# Patient Record
Sex: Female | Born: 1935 | Race: White | Hispanic: No | Marital: Single | Smoking: Never smoker
Health system: Southern US, Community
[De-identification: ages and names within clinical notes are randomized; demographics above are authoritative.]

## PROBLEM LIST (undated history)

## (undated) DIAGNOSIS — Z87442 Personal history of urinary calculi: Secondary | ICD-10-CM

## (undated) DIAGNOSIS — F419 Anxiety disorder, unspecified: Secondary | ICD-10-CM

## (undated) DIAGNOSIS — L723 Sebaceous cyst: Secondary | ICD-10-CM

## (undated) DIAGNOSIS — N200 Calculus of kidney: Secondary | ICD-10-CM

## (undated) DIAGNOSIS — R011 Cardiac murmur, unspecified: Secondary | ICD-10-CM

## (undated) HISTORY — PX: TONSILLECTOMY: SUR1361

## (undated) HISTORY — PX: OTHER SURGICAL HISTORY: SHX169

## (undated) HISTORY — PX: ABDOMINAL HYSTERECTOMY: SHX81

---

## 1998-01-19 ENCOUNTER — Ambulatory Visit (HOSPITAL_COMMUNITY): Admission: RE | Admit: 1998-01-19 | Discharge: 1998-01-19 | Payer: Self-pay | Admitting: *Deleted

## 1998-01-21 ENCOUNTER — Other Ambulatory Visit: Admission: RE | Admit: 1998-01-21 | Discharge: 1998-01-21 | Payer: Self-pay | Admitting: *Deleted

## 1998-06-02 ENCOUNTER — Observation Stay (HOSPITAL_COMMUNITY): Admission: RE | Admit: 1998-06-02 | Discharge: 1998-06-03 | Payer: Self-pay | Admitting: Gynecology

## 1999-01-19 ENCOUNTER — Encounter (INDEPENDENT_AMBULATORY_CARE_PROVIDER_SITE_OTHER): Payer: Self-pay | Admitting: Specialist

## 1999-01-19 ENCOUNTER — Ambulatory Visit (HOSPITAL_COMMUNITY): Admission: RE | Admit: 1999-01-19 | Discharge: 1999-01-19 | Payer: Self-pay | Admitting: Gastroenterology

## 1999-02-01 ENCOUNTER — Ambulatory Visit (HOSPITAL_COMMUNITY): Admission: RE | Admit: 1999-02-01 | Discharge: 1999-02-01 | Payer: Self-pay | Admitting: *Deleted

## 1999-11-23 ENCOUNTER — Encounter: Admission: RE | Admit: 1999-11-23 | Discharge: 1999-11-23 | Payer: Self-pay | Admitting: Gynecology

## 1999-11-23 ENCOUNTER — Encounter: Payer: Self-pay | Admitting: Gynecology

## 1999-11-29 ENCOUNTER — Encounter (INDEPENDENT_AMBULATORY_CARE_PROVIDER_SITE_OTHER): Payer: Self-pay

## 1999-11-29 ENCOUNTER — Other Ambulatory Visit: Admission: RE | Admit: 1999-11-29 | Discharge: 1999-11-29 | Payer: Self-pay | Admitting: Gynecology

## 2000-02-08 ENCOUNTER — Other Ambulatory Visit: Admission: RE | Admit: 2000-02-08 | Discharge: 2000-02-08 | Payer: Self-pay | Admitting: *Deleted

## 2000-02-14 ENCOUNTER — Ambulatory Visit (HOSPITAL_COMMUNITY): Admission: RE | Admit: 2000-02-14 | Discharge: 2000-02-14 | Payer: Self-pay | Admitting: *Deleted

## 2001-02-03 ENCOUNTER — Encounter: Payer: Self-pay | Admitting: Gynecology

## 2001-02-03 ENCOUNTER — Encounter: Admission: RE | Admit: 2001-02-03 | Discharge: 2001-02-03 | Payer: Self-pay | Admitting: Gynecology

## 2001-12-22 ENCOUNTER — Encounter: Payer: Self-pay | Admitting: Internal Medicine

## 2001-12-22 ENCOUNTER — Ambulatory Visit (HOSPITAL_COMMUNITY): Admission: RE | Admit: 2001-12-22 | Discharge: 2001-12-22 | Payer: Self-pay | Admitting: Internal Medicine

## 2003-06-22 ENCOUNTER — Ambulatory Visit (HOSPITAL_COMMUNITY): Admission: RE | Admit: 2003-06-22 | Discharge: 2003-06-22 | Payer: Self-pay | Admitting: Gastroenterology

## 2004-01-03 ENCOUNTER — Encounter: Admission: RE | Admit: 2004-01-03 | Discharge: 2004-01-03 | Payer: Self-pay | Admitting: Gynecology

## 2005-01-08 ENCOUNTER — Ambulatory Visit (HOSPITAL_COMMUNITY): Admission: RE | Admit: 2005-01-08 | Discharge: 2005-01-08 | Payer: Self-pay | Admitting: Internal Medicine

## 2005-06-14 ENCOUNTER — Encounter: Admission: RE | Admit: 2005-06-14 | Discharge: 2005-06-14 | Payer: Self-pay | Admitting: Internal Medicine

## 2006-04-19 ENCOUNTER — Ambulatory Visit (HOSPITAL_COMMUNITY): Admission: RE | Admit: 2006-04-19 | Discharge: 2006-04-19 | Payer: Self-pay | Admitting: Internal Medicine

## 2006-06-03 ENCOUNTER — Other Ambulatory Visit: Admission: RE | Admit: 2006-06-03 | Discharge: 2006-06-03 | Payer: Self-pay | Admitting: Internal Medicine

## 2007-05-09 ENCOUNTER — Ambulatory Visit (HOSPITAL_COMMUNITY): Admission: RE | Admit: 2007-05-09 | Discharge: 2007-05-09 | Payer: Self-pay | Admitting: Internal Medicine

## 2008-01-22 ENCOUNTER — Ambulatory Visit (HOSPITAL_COMMUNITY): Admission: RE | Admit: 2008-01-22 | Discharge: 2008-01-22 | Payer: Self-pay | Admitting: Gastroenterology

## 2008-06-07 ENCOUNTER — Ambulatory Visit: Payer: Self-pay | Admitting: Internal Medicine

## 2008-07-14 ENCOUNTER — Ambulatory Visit: Payer: Self-pay | Admitting: Internal Medicine

## 2008-11-19 ENCOUNTER — Ambulatory Visit (HOSPITAL_COMMUNITY): Admission: RE | Admit: 2008-11-19 | Discharge: 2008-11-19 | Payer: Self-pay | Admitting: Internal Medicine

## 2009-06-09 ENCOUNTER — Ambulatory Visit: Payer: Self-pay | Admitting: Internal Medicine

## 2009-06-10 ENCOUNTER — Ambulatory Visit: Payer: Self-pay | Admitting: Internal Medicine

## 2009-09-01 ENCOUNTER — Ambulatory Visit: Payer: Self-pay | Admitting: Internal Medicine

## 2009-12-02 ENCOUNTER — Ambulatory Visit (HOSPITAL_COMMUNITY): Admission: RE | Admit: 2009-12-02 | Discharge: 2009-12-02 | Payer: Self-pay | Admitting: Internal Medicine

## 2009-12-05 ENCOUNTER — Inpatient Hospital Stay (HOSPITAL_COMMUNITY): Admission: EM | Admit: 2009-12-05 | Discharge: 2009-12-06 | Payer: Self-pay | Admitting: Emergency Medicine

## 2010-03-21 ENCOUNTER — Encounter: Admission: RE | Admit: 2010-03-21 | Discharge: 2010-04-03 | Payer: Self-pay | Admitting: Internal Medicine

## 2010-09-11 LAB — BASIC METABOLIC PANEL
CO2: 25 mEq/L (ref 19–32)
Chloride: 113 mEq/L — ABNORMAL HIGH (ref 96–112)
GFR calc Af Amer: >60 mL/min (ref >60)
Potassium: 3.9 mEq/L (ref 3.5–5.1)
Sodium: 141 mEq/L (ref 135–145)

## 2010-09-11 LAB — COMPREHENSIVE METABOLIC PANEL
ALT: 11 U/L (ref 0–35)
Albumin: 3.9 g/dL (ref 3.5–5.2)
Alkaline Phosphatase: 80 U/L (ref 39–117)
BUN: 20 mg/dL (ref 6–23)
Potassium: 4.2 mEq/L (ref 3.5–5.1)
Sodium: 142 mEq/L (ref 135–145)
Total Protein: 7.1 g/dL (ref 6.0–8.3)

## 2010-09-11 LAB — CBC
Hemoglobin: 10.5 g/dL — ABNORMAL LOW (ref 12.0–15.0)
MCHC: 33.7 g/dL (ref 30.0–36.0)
MCV: 94.9 fL (ref 78.0–100.0)
Platelets: 259 10*3/uL (ref 150–400)
RBC: 3.29 MIL/uL — ABNORMAL LOW (ref 3.87–5.11)
RDW: 13.3 % (ref 11.5–15.5)

## 2010-09-11 LAB — DIFFERENTIAL
Basophils Relative: 0 % (ref 0–1)
Basophils Relative: 0 % (ref 0–1)
Eosinophils Absolute: 0 10*3/uL (ref 0.0–0.7)
Lymphs Abs: 1.1 10*3/uL (ref 0.7–4.0)
Monocytes Absolute: 0.4 10*3/uL (ref 0.1–1.0)
Monocytes Absolute: 0.8 10*3/uL (ref 0.1–1.0)
Monocytes Relative: 11 % (ref 3–12)
Monocytes Relative: 3 % (ref 3–12)
Neutro Abs: 12.2 10*3/uL — ABNORMAL HIGH (ref 1.7–7.7)

## 2010-09-11 LAB — URINALYSIS, ROUTINE W REFLEX MICROSCOPIC
Glucose, UA: NEGATIVE mg/dL
Protein, ur: NEGATIVE mg/dL
pH: 7 (ref 5.0–8.0)

## 2010-09-11 LAB — URINE MICROSCOPIC-ADD ON

## 2010-09-11 LAB — URINE CULTURE

## 2010-11-07 NOTE — Op Note (Signed)
NAMESABRA, SESSLER               ACCOUNT NO.:  [id number]   MEDICAL RECORD NO.:  [id number]          PATIENT TYPE:  AMB   LOCATION:  ENDO                         FACILITY:  [facility]   PHYSICIAN:  Petra Kuba, M.D.    DATE OF BIRTH:  02/06/1936   DATE OF PROCEDURE:  01/22/2008  DATE OF DISCHARGE:                               OPERATIVE REPORT   PROCEDURE:  Colonoscopy.   INDICATIONS FOR PROCEDURE:  Family history of colon cancer, personal  history of colon polyps, due for colonic screening.   Consent was signed after risks, benefits, methods, options thoroughly  discussed multiple times in the past.   MEDICINES USED:  Diprivan per anesthesia 180 mcg, fentanyl 50 mcg,  Versed 1 mg.   PROCEDURE IN DETAIL:  Rectal inspection internal and external  hemorrhoids small.  Digital exam was negative.  Pediatric colonoscope  was inserted and despite a long looping tortuous colon was able to be  advanced to the cecum.  No abdominal pressure or position changes were  needed.  There was significant sigmoid and descending diverticula.  Moderate some large and small but no other abnormalities seen on  insertion.  Cecum was identified by the appendiceal orifice and  ileocecal valve.  Slow withdrawal through the colon.  There was a  moderate amount of stool adherent to the wall which required washing and  suctioning.  On slow withdrawal through the colon no polypoid lesions,  masses or other abnormalities were seen but the diverticula mentioned  above.  This was slowly retrieved back to the rectum.  Once back in the  rectum the anorectal pull-through and retroflexion confirmed the small  hemorrhoids.  Scope was straightened, air was suctioned, scope removed.  The patient tolerated the procedure well.  There was no obvious  immediate complication.   ENDOSCOPIC DIAGNOSES:  1. Internal and external small hemorrhoids.  2. Left-sided moderate diverticula.  3. Otherwise within normal limits to the  cecum.   PLAN:  Recheck colon screening in 5 years either proceed the same way or  virtual colonoscopy depending on what is available, what insurance will  cover, etc.  I will be happy to see her back sooner p.r.n. otherwise  return care to Dr. Lenord Fellers for the customary healthcare screening and  maintenance.           ______________________________  Petra Kuba, M.D.     MEM/MEDQ  D:  01/22/2008  T:  01/22/2008  Job:  [id number]   cc:   Luanna Cole. Lenord Fellers, M.D.  Fax: [phone]

## 2011-01-30 ENCOUNTER — Other Ambulatory Visit: Payer: Self-pay | Admitting: Internal Medicine

## 2011-01-30 ENCOUNTER — Other Ambulatory Visit (HOSPITAL_COMMUNITY): Payer: Self-pay | Admitting: Internal Medicine

## 2011-01-30 DIAGNOSIS — Z1231 Encounter for screening mammogram for malignant neoplasm of breast: Secondary | ICD-10-CM

## 2011-01-31 ENCOUNTER — Ambulatory Visit
Admission: RE | Admit: 2011-01-31 | Discharge: 2011-01-31 | Disposition: A | Discharge status: Home / Self Care | Payer: BC Managed Care – PPO | Source: Ambulatory Visit | Attending: Internal Medicine | Admitting: Internal Medicine

## 2011-01-31 DIAGNOSIS — Z1231 Encounter for screening mammogram for malignant neoplasm of breast: Secondary | ICD-10-CM

## 2011-02-02 ENCOUNTER — Ambulatory Visit (HOSPITAL_COMMUNITY): Payer: Self-pay

## 2012-07-03 ENCOUNTER — Other Ambulatory Visit: Payer: Self-pay | Admitting: Internal Medicine

## 2012-07-03 DIAGNOSIS — R319 Hematuria, unspecified: Secondary | ICD-10-CM

## 2012-07-07 ENCOUNTER — Ambulatory Visit
Admission: RE | Admit: 2012-07-07 | Discharge: 2012-07-07 | Disposition: A | Discharge status: Home / Self Care | Payer: BC Managed Care – PPO | Source: Ambulatory Visit | Attending: Internal Medicine | Admitting: Internal Medicine

## 2012-07-07 DIAGNOSIS — R319 Hematuria, unspecified: Secondary | ICD-10-CM

## 2012-12-05 ENCOUNTER — Other Ambulatory Visit: Payer: Self-pay | Admitting: Internal Medicine

## 2012-12-05 DIAGNOSIS — R319 Hematuria, unspecified: Secondary | ICD-10-CM

## 2012-12-05 DIAGNOSIS — R109 Unspecified abdominal pain: Secondary | ICD-10-CM

## 2012-12-09 ENCOUNTER — Ambulatory Visit
Admission: RE | Admit: 2012-12-09 | Discharge: 2012-12-09 | Disposition: A | Discharge status: Home / Self Care | Payer: BC Managed Care – PPO | Source: Ambulatory Visit | Attending: Internal Medicine | Admitting: Internal Medicine

## 2012-12-09 DIAGNOSIS — R319 Hematuria, unspecified: Secondary | ICD-10-CM

## 2012-12-09 DIAGNOSIS — R109 Unspecified abdominal pain: Secondary | ICD-10-CM

## 2012-12-09 MED ORDER — IOHEXOL 300 MG/ML  SOLN
125.0000 mL | Freq: Once | INTRAMUSCULAR | Status: AC | PRN
  Administered 2012-12-09: 125 mL via INTRAVENOUS

## 2012-12-23 ENCOUNTER — Other Ambulatory Visit: Payer: Self-pay | Admitting: Internal Medicine

## 2012-12-23 DIAGNOSIS — N289 Disorder of kidney and ureter, unspecified: Secondary | ICD-10-CM

## 2013-01-09 ENCOUNTER — Other Ambulatory Visit: Payer: BC Managed Care – PPO

## 2013-01-12 ENCOUNTER — Ambulatory Visit
Admission: RE | Admit: 2013-01-12 | Discharge: 2013-01-12 | Disposition: A | Discharge status: Home / Self Care | Payer: BC Managed Care – PPO | Source: Ambulatory Visit | Attending: Internal Medicine | Admitting: Internal Medicine

## 2013-01-12 DIAGNOSIS — N289 Disorder of kidney and ureter, unspecified: Secondary | ICD-10-CM

## 2013-01-12 MED ORDER — GADOBENATE DIMEGLUMINE 529 MG/ML IV SOLN
11.0000 mL | Freq: Once | INTRAVENOUS | Status: AC | PRN
  Administered 2013-01-12: 11 mL via INTRAVENOUS

## 2015-01-26 ENCOUNTER — Other Ambulatory Visit: Payer: Self-pay | Admitting: Surgery

## 2015-02-11 ENCOUNTER — Other Ambulatory Visit: Payer: Self-pay | Admitting: Surgery

## 2015-03-02 ENCOUNTER — Encounter (HOSPITAL_BASED_OUTPATIENT_CLINIC_OR_DEPARTMENT_OTHER): Payer: Self-pay | Admitting: *Deleted

## 2015-03-06 NOTE — H&P (Signed)
  Vanessa Cannon  Location: [facility] Patient #: [id number] DOB: [birth year] Single / Language: [name] / Race: White Female  History of Present Illness (Maliya Marich A. Ninfa Linden MD Patient words: seb cyst neck.  The patient is a 79 year old female who presents with a subcutaneous abscess. This is a pleasant female referred by Dr. [name] for infected sebaceous cyst on the back of her neck. She reports that she has had it for many years. It has intermittently become infected. She has just finished a course of antibiotics   Other Problems Davy Pique Bynum, [place]; Cholelithiasis Gastroesophageal Reflux Disease Heart murmur Hemorrhoids Kidney Stone  Past Surgical History Marjean Donna, CMA;  Colon Polyp Removal - Colonoscopy Hysterectomy (not due to cancer) - Complete  Diagnostic Studies History Marjean Donna, CMA;  Colonoscopy 5-10 years ago Mammogram 1-3 years ago Pap Smear >5 years ago  Allergies Marjean Donna, CMA; No Known Drug Allergies08/08/2014  Medication History (Sonya Bynum, CMA;  No Current Medications Medications Reconciled  Social History Davy Pique Bynum, CMA; Alcohol use Moderate alcohol use. Caffeine use Coffee, Tea. No drug use Tobacco use Never smoker.  Family History Marjean Donna, [place];  Cerebrovascular Accident Sister. Heart Disease Mother. Hypertension Father. Respiratory Condition Father.  Pregnancy / Birth History Marjean Donna, [place];  Age at menarche 60 years. Age of menopause 89-50  Review of Systems ([place];  HEENT Not Present- Earache, Hearing Loss, Hoarseness, Nose Bleed, Oral Ulcers, Ringing in the Ears, Seasonal Allergies, Sinus Pain, Sore Throat, Visual Disturbances, Wears glasses/contact lenses and Yellow Eyes. Respiratory Not Present- Bloody sputum, Chronic Cough, Difficulty Breathing, Snoring and Wheezing. Breast Not Present- Breast Mass, Breast Pain, Nipple Discharge and Skin Changes. Cardiovascular Not  Present- Chest Pain, Difficulty Breathing Lying Down, Leg Cramps, Palpitations, Rapid Heart Rate, Shortness of Breath and Swelling of Extremities. Gastrointestinal Not Present- Abdominal Pain, Bloating, Bloody Stool, Change in Bowel Habits, Chronic diarrhea, Constipation, Difficulty Swallowing, Excessive gas, Gets full quickly at meals, Hemorrhoids, Indigestion, Nausea, Rectal Pain and Vomiting. Endocrine Not Present- Cold Intolerance, Excessive Hunger, Hair Changes, Heat Intolerance, Hot flashes and New Diabetes. Hematology Present- Easy Bruising. Not Present- Excessive bleeding, Gland problems, HIV and Persistent Infections.   Vitals (Sonya Bynum CMA  Weight: 124 lb Height: 64in Body Surface Area: 1.59 m Body Mass Index: 21.28 kg/m Temp.: 12F(Temporal)  Pulse: 79 (Regular)  BP: 138/74 (Sitting, Left Arm, Standard)    Physical Exam (Keviana Guida A. Ninfa Linden MD;  The physical exam findings are as follows: Note:On exam, there is a very large sebaceous cyst on the back of her neck. Lungs clear CV RRR Abd soft, NT      Assessment & Plan (Kashten Gowin A. Ninfa Linden MD; CHRONICALLY FECTED SEBACEOUS CYST (706.2  L72.3)  Plan excision of the posterior neck cyst in the OR to prevent recurrent infections.  I discussed the risks which include, but are not limited to bleeding, infection, recurrence, etc.  She agrees to proceed.

## 2015-03-07 ENCOUNTER — Encounter (HOSPITAL_BASED_OUTPATIENT_CLINIC_OR_DEPARTMENT_OTHER): Payer: Self-pay | Admitting: *Deleted

## 2015-03-07 ENCOUNTER — Ambulatory Visit (HOSPITAL_BASED_OUTPATIENT_CLINIC_OR_DEPARTMENT_OTHER): Payer: BC Managed Care – PPO | Admitting: Anesthesiology

## 2015-03-07 ENCOUNTER — Encounter (HOSPITAL_BASED_OUTPATIENT_CLINIC_OR_DEPARTMENT_OTHER)
Admission: RE | Disposition: A | Discharge status: Home / Self Care | Payer: Self-pay | Source: Ambulatory Visit | Attending: Surgery

## 2015-03-07 ENCOUNTER — Ambulatory Visit (HOSPITAL_BASED_OUTPATIENT_CLINIC_OR_DEPARTMENT_OTHER)
Admission: RE | Admit: 2015-03-07 | Discharge: 2015-03-07 | Disposition: A | Discharge status: Home / Self Care | Payer: BC Managed Care – PPO | Source: Ambulatory Visit | Attending: Surgery | Admitting: Surgery

## 2015-03-07 DIAGNOSIS — R011 Cardiac murmur, unspecified: Secondary | ICD-10-CM | POA: Insufficient documentation

## 2015-03-07 DIAGNOSIS — K219 Gastro-esophageal reflux disease without esophagitis: Secondary | ICD-10-CM | POA: Diagnosis not present

## 2015-03-07 DIAGNOSIS — L720 Epidermal cyst: Secondary | ICD-10-CM | POA: Insufficient documentation

## 2015-03-07 DIAGNOSIS — Z87442 Personal history of urinary calculi: Secondary | ICD-10-CM | POA: Diagnosis not present

## 2015-03-07 DIAGNOSIS — L723 Sebaceous cyst: Secondary | ICD-10-CM | POA: Diagnosis not present

## 2015-03-07 HISTORY — DX: Cardiac murmur, unspecified: R01.1

## 2015-03-07 HISTORY — PX: EAR CYST EXCISION: SHX22

## 2015-03-07 HISTORY — DX: Sebaceous cyst: L72.3

## 2015-03-07 HISTORY — DX: Anxiety disorder, unspecified: F41.9

## 2015-03-07 LAB — POCT HEMOGLOBIN-HEMACUE: HEMOGLOBIN: 10.9 g/dL — AB (ref 12.0–15.0)

## 2015-03-07 SURGERY — CYST REMOVAL
Anesthesia: Monitor Anesthesia Care | Site: Neck | Laterality: Right

## 2015-03-07 MED ORDER — FENTANYL CITRATE (PF) 100 MCG/2ML IJ SOLN
INTRAMUSCULAR | Status: AC
  Filled 2015-03-07: qty 4

## 2015-03-07 MED ORDER — FENTANYL CITRATE (PF) 100 MCG/2ML IJ SOLN: 25.0000 ug | INTRAMUSCULAR | Status: DC | PRN

## 2015-03-07 MED ORDER — LIDOCAINE-EPINEPHRINE (PF) 1 %-1:200000 IJ SOLN
INTRAMUSCULAR | Status: DC | PRN
  Administered 2015-03-07: 10 mL

## 2015-03-07 MED ORDER — LIDOCAINE HCL (CARDIAC) 20 MG/ML IV SOLN
INTRAVENOUS | Status: DC | PRN
  Administered 2015-03-07: 50 mg via INTRAVENOUS

## 2015-03-07 MED ORDER — ONDANSETRON HCL 4 MG/2ML IJ SOLN: 4.0000 mg | Freq: Once | INTRAMUSCULAR | Status: DC | PRN

## 2015-03-07 MED ORDER — CEFAZOLIN SODIUM-DEXTROSE 2-3 GM-% IV SOLR
INTRAVENOUS | Status: AC
  Filled 2015-03-07: qty 50

## 2015-03-07 MED ORDER — FENTANYL CITRATE (PF) 100 MCG/2ML IJ SOLN
50.0000 ug | INTRAMUSCULAR | Status: DC | PRN
  Administered 2015-03-07: 50 ug via INTRAVENOUS

## 2015-03-07 MED ORDER — PROPOFOL 10 MG/ML IV BOLUS
INTRAVENOUS | Status: DC | PRN
  Administered 2015-03-07 (×2): 20 mg via INTRAVENOUS

## 2015-03-07 MED ORDER — CEFAZOLIN SODIUM-DEXTROSE 2-3 GM-% IV SOLR
2.0000 g | INTRAVENOUS | Status: AC
  Administered 2015-03-07: 2 g via INTRAVENOUS

## 2015-03-07 MED ORDER — LACTATED RINGERS IV SOLN
INTRAVENOUS | Status: DC
  Administered 2015-03-07: 07:00:00 via INTRAVENOUS

## 2015-03-07 MED ORDER — PROPOFOL 500 MG/50ML IV EMUL
INTRAVENOUS | Status: AC
  Filled 2015-03-07: qty 50

## 2015-03-07 MED ORDER — SCOPOLAMINE 1 MG/3DAYS TD PT72: 1.0000 | MEDICATED_PATCH | Freq: Once | TRANSDERMAL | Status: DC | PRN

## 2015-03-07 MED ORDER — GLYCOPYRROLATE 0.2 MG/ML IJ SOLN: 0.2000 mg | Freq: Once | INTRAMUSCULAR | Status: DC | PRN

## 2015-03-07 MED ORDER — ONDANSETRON HCL 4 MG/2ML IJ SOLN
INTRAMUSCULAR | Status: AC
  Filled 2015-03-07: qty 2

## 2015-03-07 MED ORDER — MIDAZOLAM HCL 2 MG/2ML IJ SOLN: 1.0000 mg | INTRAMUSCULAR | Status: DC | PRN

## 2015-03-07 MED ORDER — ONDANSETRON HCL 4 MG/2ML IJ SOLN
INTRAMUSCULAR | Status: DC | PRN
Start: 2015-03-07 — End: 2015-03-07
  Administered 2015-03-07: 4 mg via INTRAVENOUS

## 2015-03-07 MED ORDER — TRAMADOL HCL 50 MG PO TABS: 50.0000 mg | ORAL_TABLET | Freq: Four times a day (QID) | ORAL | Status: DC | PRN

## 2015-03-07 SURGICAL SUPPLY — 44 items
BLADE CLIPPER SURG (BLADE) IMPLANT
BLADE HEX COATED 2.75 (ELECTRODE) ×4 IMPLANT
BLADE SURG 15 STRL LF DISP TIS (BLADE) ×2 IMPLANT
BLADE SURG 15 STRL SS (BLADE) ×4
CANISTER SUCT 1200ML W/VALVE (MISCELLANEOUS) IMPLANT
CHLORAPREP W/TINT 26ML (MISCELLANEOUS) ×1 IMPLANT
COVER BACK TABLE 60X90IN (DRAPES) ×4 IMPLANT
COVER MAYO STAND STRL (DRAPES) ×4 IMPLANT
DECANTER SPIKE VIAL GLASS SM (MISCELLANEOUS) IMPLANT
DRAPE LAPAROTOMY 100X72 PEDS (DRAPES) ×4 IMPLANT
DRAPE UTILITY XL STRL (DRAPES) ×4 IMPLANT
ELECT REM PT RETURN 9FT ADLT (ELECTROSURGICAL) ×4
ELECTRODE REM PT RTRN 9FT ADLT (ELECTROSURGICAL) ×2 IMPLANT
GLOVE BIOGEL PI IND STRL 7.5 (GLOVE) ×1 IMPLANT
GLOVE BIOGEL PI INDICATOR 7.5 (GLOVE) ×2
GLOVE EXAM NITRILE MD LF STRL (GLOVE) ×3 IMPLANT
GLOVE SURG SIGNA 7.5 PF LTX (GLOVE) ×4 IMPLANT
GLOVE SURG SS PI 7.0 STRL IVOR (GLOVE) ×3 IMPLANT
GOWN STRL REUS W/ TWL LRG LVL3 (GOWN DISPOSABLE) ×2 IMPLANT
GOWN STRL REUS W/ TWL XL LVL3 (GOWN DISPOSABLE) ×2 IMPLANT
GOWN STRL REUS W/TWL LRG LVL3 (GOWN DISPOSABLE) ×4
GOWN STRL REUS W/TWL XL LVL3 (GOWN DISPOSABLE) ×4
LIQUID BAND (GAUZE/BANDAGES/DRESSINGS) ×5 IMPLANT
NDL HYPO 25X1 1.5 SAFETY (NEEDLE) ×1 IMPLANT
NEEDLE HYPO 25X1 1.5 SAFETY (NEEDLE) ×4 IMPLANT
NS IRRIG 1000ML POUR BTL (IV SOLUTION) IMPLANT
PACK BASIN DAY SURGERY FS (CUSTOM PROCEDURE TRAY) ×4 IMPLANT
PENCIL BUTTON HOLSTER BLD 10FT (ELECTRODE) ×4 IMPLANT
SLEEVE SCD COMPRESS KNEE MED (MISCELLANEOUS) IMPLANT
SPONGE LAP 4X18 X RAY DECT (DISPOSABLE) ×4 IMPLANT
SUT MNCRL AB 4-0 PS2 18 (SUTURE) ×3 IMPLANT
SUT PROLENE 3 0 PS 2 (SUTURE) IMPLANT
SUT VIC AB 2-0 SH 27 (SUTURE)
SUT VIC AB 2-0 SH 27XBRD (SUTURE) IMPLANT
SUT VIC AB 3-0 SH 27 (SUTURE) ×4
SUT VIC AB 3-0 SH 27X BRD (SUTURE) ×1 IMPLANT
SYR BULB 3OZ (MISCELLANEOUS) IMPLANT
SYR CONTROL 10ML LL (SYRINGE) ×4 IMPLANT
TOWEL OR 17X24 6PK STRL BLUE (TOWEL DISPOSABLE) ×4 IMPLANT
TOWEL OR NON WOVEN STRL DISP B (DISPOSABLE) ×4 IMPLANT
TRAY DSU PREP LF (CUSTOM PROCEDURE TRAY) ×3 IMPLANT
TUBE CONNECTING 20'X1/4 (TUBING)
TUBE CONNECTING 20X1/4 (TUBING) IMPLANT
YANKAUER SUCT BULB TIP NO VENT (SUCTIONS) IMPLANT

## 2015-03-07 NOTE — Interval H&P Note (Signed)
History and Physical Interval Note: no change in H and P  03/07/2015 7:09 AM  Vanessa Cannon  has presented today for surgery, with the diagnosis of Sebaceous Cyst  The various methods of treatment have been discussed with the patient and family. After consideration of risks, benefits and other options for treatment, the patient has consented to  Procedure(s): EXCISION OF CHRONIC SEBACEOUS CYST ON NECK (N/A) as a surgical intervention .  The patient's history has been reviewed, patient examined, no change in status, stable for surgery.  I have reviewed the patient's chart and labs.  Questions were answered to the patient's satisfaction.     Lashauna Arpin A

## 2015-03-07 NOTE — Op Note (Signed)
CYST EXCISION RIGHT POSTERIOR NECK  Procedure Note  Vanessa Cannon 03/07/2015   Pre-op Diagnosis: Sebaceous Cyst Right Posterior Neck     Post-op Diagnosis: same  Procedure(s): CYST EXCISION RIGHT POSTERIOR NECK (1 CM )  Surgeon(s): Coralie Keens, MD  Anesthesia: Monitor Anesthesia Care  Staff:  Circulator: Maurene Capes, RN Scrub Person: Romero Liner, CST  Estimated Blood Loss: Minimal               Specimens: SENT TO [facility]  Vanessa Cannon   Date: 03/07/2015  Time: 10:16 AM

## 2015-03-07 NOTE — Discharge Instructions (Signed)
Ok to shower starting tomorrow  Ice pack ibuprofen and/or tylenol for pain    Post Anesthesia Home Care Instructions  Activity: Get plenty of rest for the remainder of the day. A responsible adult should stay with you for 24 hours following the procedure.  For the next 24 hours, DO NOT: -Drive a car -Paediatric nurse -Drink alcoholic beverages -Take any medication unless instructed by your physician -Make any legal decisions or sign important papers.  Meals: Start with liquid foods such as gelatin or soup. Progress to regular foods as tolerated. Avoid greasy, spicy, heavy foods. If nausea and/or vomiting occur, drink only clear liquids until the nausea and/or vomiting subsides. Call your physician if vomiting continues.  Special Instructions/Symptoms: Your throat may feel dry or sore from the anesthesia or the breathing tube placed in your throat during surgery. If this causes discomfort, gargle with warm salt water. The discomfort should disappear within 24 hours.  If you had a scopolamine patch placed behind your ear for the management of post- operative nausea and/or vomiting:  1. The medication in the patch is effective for 72 hours, after which it should be removed.  Wrap patch in a tissue and discard in the trash. Wash hands thoroughly with soap and water. 2. You may remove the patch earlier than 72 hours if you experience unpleasant side effects which may include dry mouth, dizziness or visual disturbances. 3. Avoid touching the patch. Wash your hands with soap and water after contact with the patch.

## 2015-03-07 NOTE — Transfer of Care (Signed)
Immediate Anesthesia Transfer of Care Note  Patient: [name]  Procedure(s) Performed: Procedure(s): CYST EXCISION RIGHT POSTERIOR NECK (Right)  Patient Location: PACU  Anesthesia Type:MAC  Level of Consciousness: awake, alert  and oriented  Airway & Oxygen Therapy: Patient Spontanous Breathing and Patient connected to face mask oxygen  Post-op Assessment: Report given to RN and Post -op Vital signs reviewed and stable  Post vital signs: Reviewed and stable  Last Vitals:  Filed Vitals:   03/07/15 1023  BP: 127/45  Pulse: 84  Temp:   Resp:     Complications: No apparent anesthesia complications

## 2015-03-07 NOTE — Anesthesia Postprocedure Evaluation (Signed)
  Anesthesia Post-op Note  Patient: Vanessa Cannon  Procedure(s) Performed: Procedure(s) (LRB): CYST EXCISION RIGHT POSTERIOR NECK (Right)  Patient Location: PACU  Anesthesia Type: MAC  Level of Consciousness: awake and alert   Airway and Oxygen Therapy: Patient Spontanous Breathing  Post-op Pain: mild  Post-op Assessment: Post-op Vital signs reviewed, Patient's Cardiovascular Status Stable, Respiratory Function Stable, Patent Airway and No signs of Nausea or vomiting  Last Vitals:  Filed Vitals:   03/07/15 1128  BP: 138/48  Pulse: 71  Temp: 36.7 C  Resp: 16    Post-op Vital Signs: stable   Complications: No apparent anesthesia complications

## 2015-03-07 NOTE — Anesthesia Preprocedure Evaluation (Signed)
Anesthesia Evaluation  Patient identified by MRN, date of birth, ID band Patient awake    Reviewed: Allergy & Precautions, NPO status , Patient's Chart, lab work & pertinent test results  History of Anesthesia Complications Negative for: history of anesthetic complications  Airway Mallampati: II  TM Distance: >3 FB Neck ROM: Full    Dental  (+) Teeth Intact, Dental Advisory Given   Pulmonary neg pulmonary ROS,    Pulmonary exam normal breath sounds clear to auscultation       Cardiovascular Exercise Tolerance: Good (-) hypertension(-) angina(-) Past MI negative cardio ROS Normal cardiovascular exam Rhythm:Regular Rate:Normal     Neuro/Psych PSYCHIATRIC DISORDERS Anxiety negative neurological ROS     GI/Hepatic negative GI ROS, Neg liver ROS,   Endo/Other  negative endocrine ROS  Renal/GU negative Renal ROS     Musculoskeletal negative musculoskeletal ROS (+)   Abdominal   Peds  Hematology  (+) Blood dyscrasia, anemia ,   Anesthesia Other Findings Day of surgery medications reviewed with the patient.  Reproductive/Obstetrics                             Anesthesia Physical Anesthesia Plan  ASA: II  Anesthesia Plan: MAC   Post-op Pain Management:    Induction: Intravenous  Airway Management Planned: Nasal Cannula  Additional Equipment:   Intra-op Plan:   Post-operative Plan:   Informed Consent: I have reviewed the patients History and Physical, chart, labs and discussed the procedure including the risks, benefits and alternatives for the proposed anesthesia with the patient or authorized representative who has indicated his/her understanding and acceptance.   Dental advisory given  Plan Discussed with: CRNA and Anesthesiologist  Anesthesia Plan Comments: (Discussed risks/benefits/alternatives to MAC sedation including need for ventilatory support, hypotension, need for  conversion to general anesthesia.  All patient questions answered.  Patient wished to proceed.)        Anesthesia Quick Evaluation

## 2015-03-08 ENCOUNTER — Encounter (HOSPITAL_BASED_OUTPATIENT_CLINIC_OR_DEPARTMENT_OTHER): Payer: Self-pay | Admitting: Surgery

## 2015-03-08 NOTE — Op Note (Signed)
Vanessa Cannon, Vanessa Cannon               ACCOUNT NO.:  [id number]  MEDICAL RECORD NO.:  [id number]  LOCATION:                               FACILITY:  [facility]  PHYSICIAN:  Coralie Keens, M.D. DATE OF BIRTH:  10/05/1935  DATE OF PROCEDURE:  03/07/2015 DATE OF DISCHARGE:  03/07/2015                              OPERATIVE REPORT   PREOPERATIVE DIAGNOSIS:  Chronically infected right posterior neck sebaceous cyst.  POSTOPERATIVE DIAGNOSIS:  Chronically infected right posterior neck sebaceous cyst.  PROCEDURE:  Excision of 1 cm right posterior neck sebaceous cyst.  SURGEON:  Coralie Keens, M.D.  ANESTHESIA:  1% lidocaine and monitored anesthesia care.  ESTIMATED BLOOD LOSS:  Minimal.  INDICATIONS:  This is a 79 year old female who has had multiple infections of a sebaceous cyst on the back of her neck.  Because of this, decision was made to excise the cyst.  PROCEDURE IN DETAIL:  The patient was brought to the operating room, identified as [name].  She was placed supine on the operating room table and placed in the left lateral decubitus position. Anesthesia was then induced.  Her posterior neck was then prepped and draped in usual sterile fashion.  I anesthetized the skin around the previous scar with lidocaine.  I then made an elliptical incision incorporated scar with the scalpel.  I then took this down into the subcutaneous tissue with the cautery.  I was able to excise the cyst and its capsule in its entirety with the cautery and a scalpel.  This was sent to Pathology for evaluation.  I then achieved hemostasis with the cautery.  I anesthetized the wound further with lidocaine.  I closed subcutaneous tissue with interrupted 3-0 Vicryl sutures and closed the skin with a running 4-0 Monocryl.  Skin glue was then applied.  The patient tolerated the procedure well.  All the counts were correct at the end of the procedure.  The patient was taken in a stable condition to the  operating room.     Coralie Keens, M.D.     DB/MEDQ  D:  03/07/2015  T:  03/08/2015  Job:  [id number]

## 2015-04-06 IMAGING — CT CT ABD-PEL WO/W CM
3 of 10 series · 12 of 46 positions shown, 18 images · IV contrast (WATER & [ID] OMNI 300)
Comparison: none

CLINICAL DATA: Hematuria his.  History of ureteral injury

CT ABDOMEN AND PELVIS WITHOUT AND WITH CONTRAST
TECHNIQUE: Multidetector CT imaging of the abdomen and pelvis was
performed without contrast material in one or both body regions,
followed by contrast material(s) and further sections in one or
both body regions.
Contrast: 125mL OMNIPAQUE IOHEXOL 300 MG/ML  SOLN
BUN and creatinine were obtained on site at [HOSPITAL] at
[HOSPITAL]..
Results:  BUN 12 mg/dL,  Creatinine 0.7 mg/dL.

[Series 2: hematuria a/p w/o >45 · axial · non-contrast · 0.66mm/px · z∈[-339,-299]mm · 2 of 74 slices shown]
[im 9/74  soft-tissue]
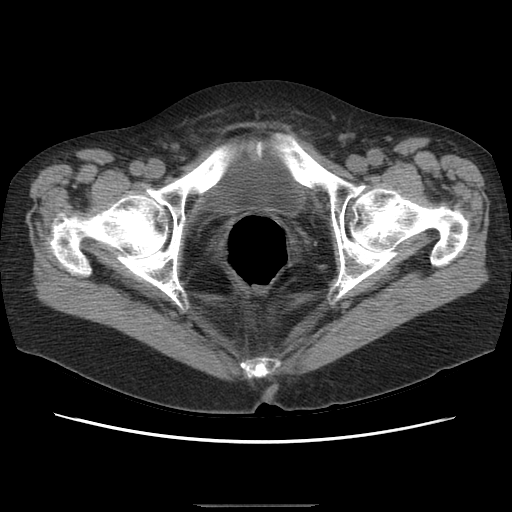
[im 17/74  soft-tissue]
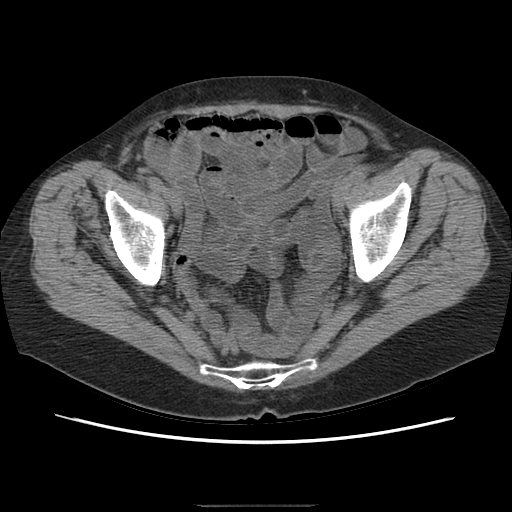

[Series 8: hematuria prone 10 min delay · axial · delayed · 0.66mm/px · z∈[-358,-38]mm · 8 of 83 slices shown, 13 images]
[im 10/83  soft-tissue]
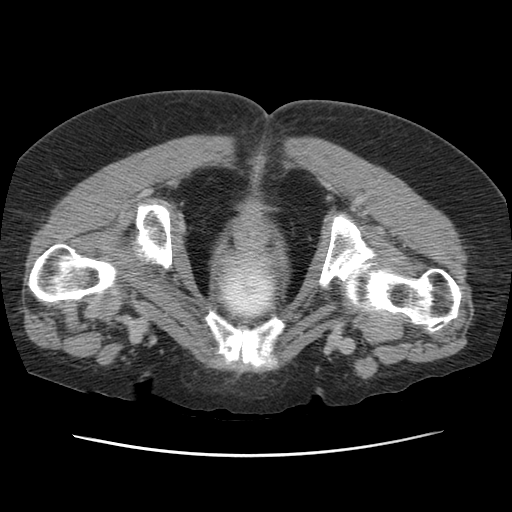
[im 10/83  bone]
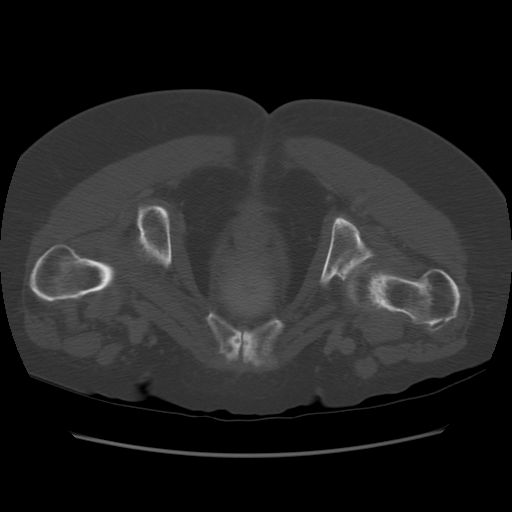
[im 19/83  soft-tissue]
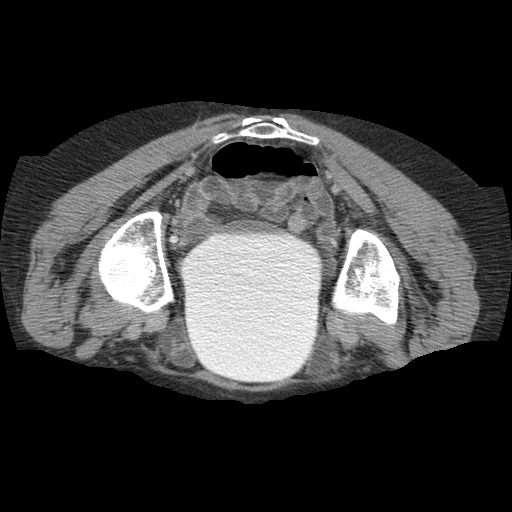
[im 28/83  soft-tissue]
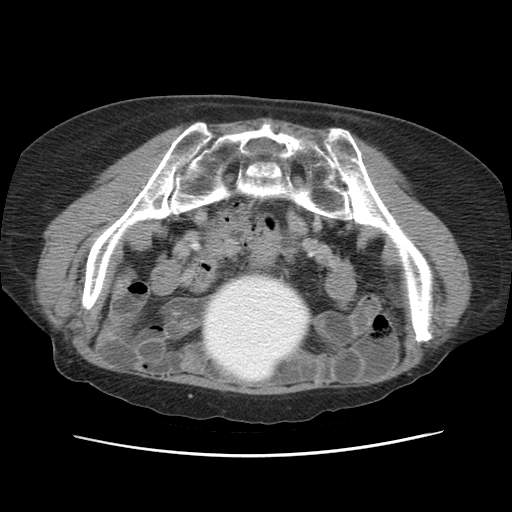
[im 37/83  soft-tissue]
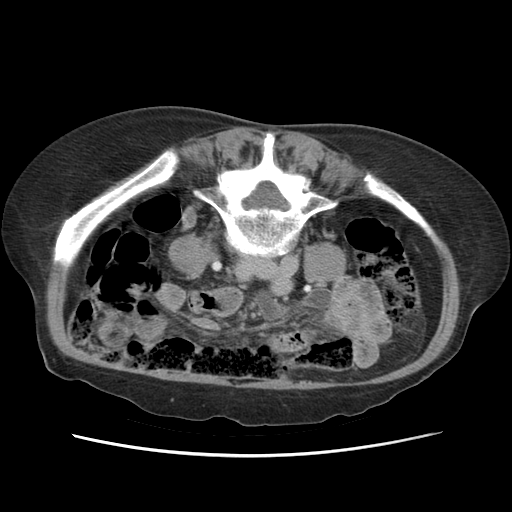
[im 46/83  soft-tissue]
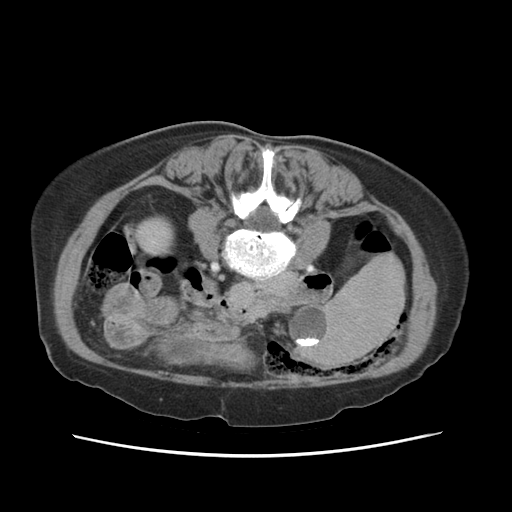
[im 46/83  lung]
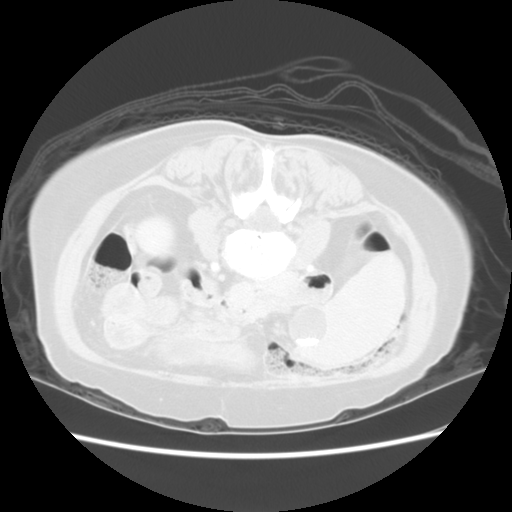
[im 55/83  soft-tissue]
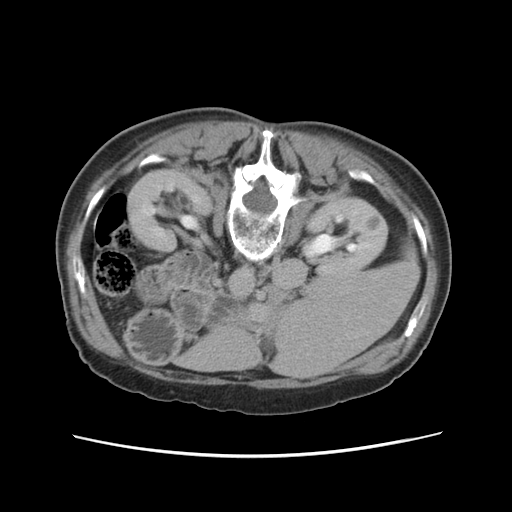
[im 55/83  lung]
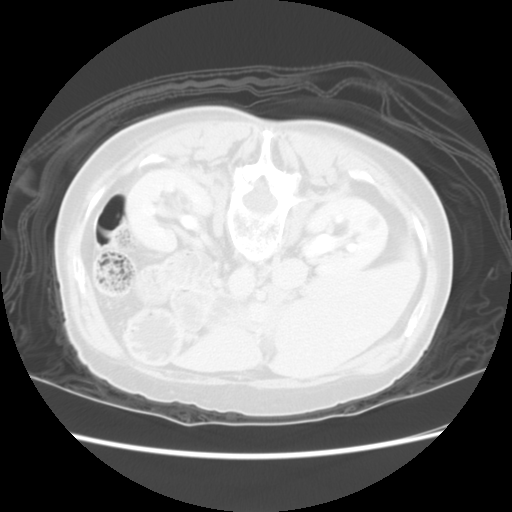
[im 64/83  soft-tissue]
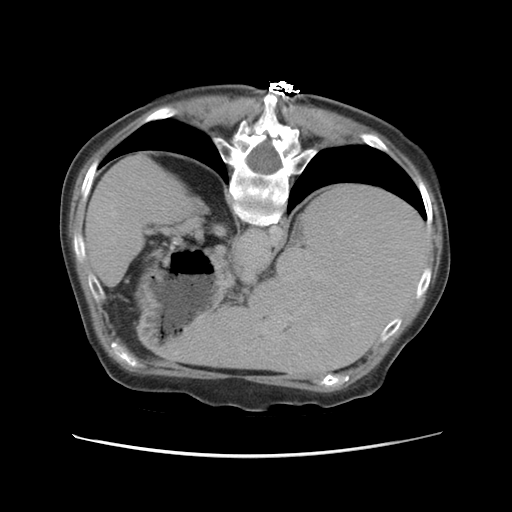
[im 64/83  lung]
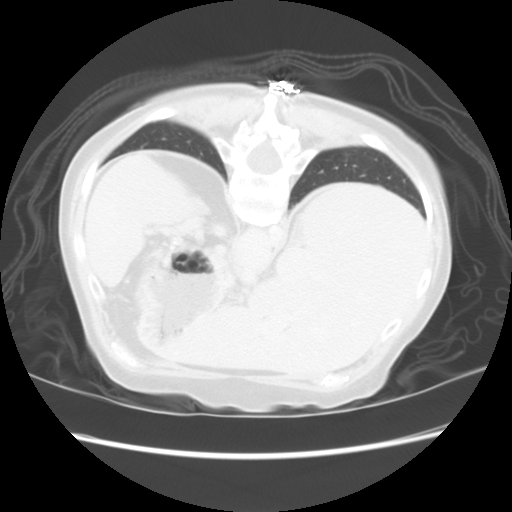
[im 73/83  soft-tissue]
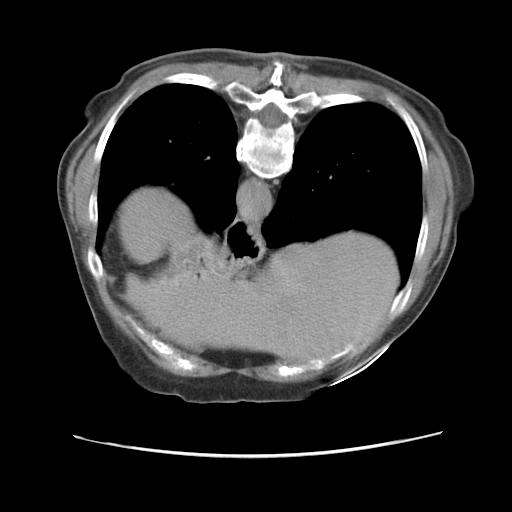
[im 73/83  lung]
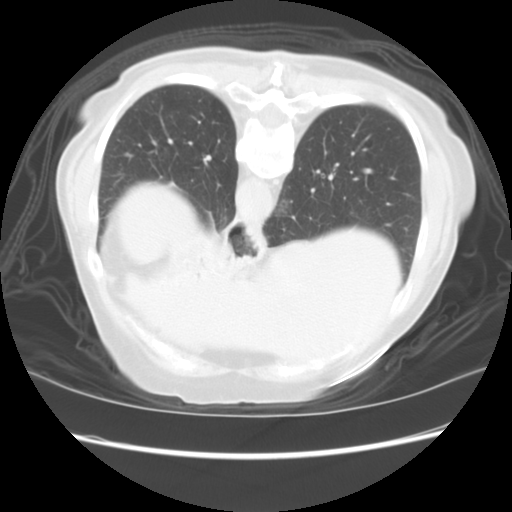

[Series 300: cor w/o · coronal · non-contrast · 0.80mm/px · 2 of 98 slices shown, 3 images]
[im 33/98  soft-tissue]
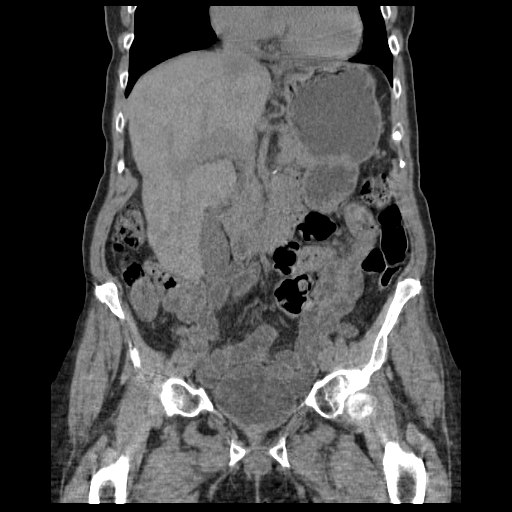
[im 33/98  bone]
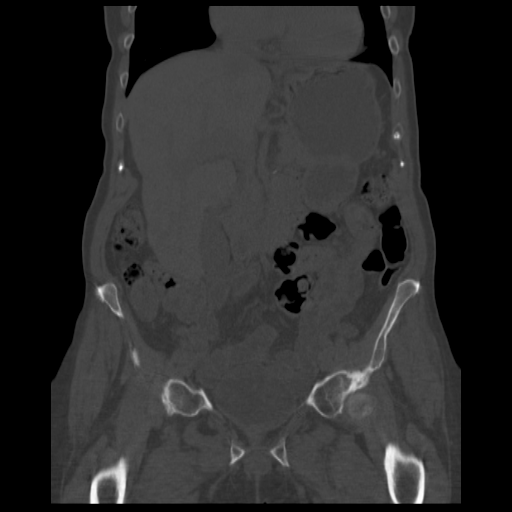
[im 65/98  soft-tissue]
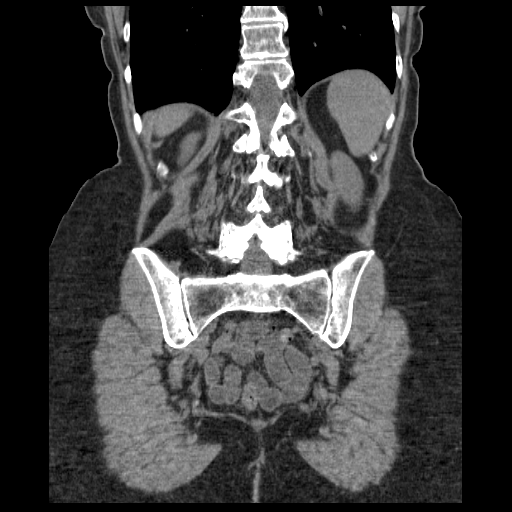

[12 of 46 positions shown; findings below may reference images not displayed]

FINDINGS: Renal:  Non-IV contrast images demonstrate no nephrolithiasis or
ureterolithiasis.  Cortical phase imaging demonstrate a low density
lesion in the cortex of the right kidney measuring 10 mm (image 27,
series 4) which cannot be characterized as non-enhancing in part
due to its small size.  Lesion is increased in size from comparison
CT [DATE]/ 16655.  There are no filling defects within the
collecting systems or ureters.

Lung bases are clear.  No pericardial fluid.  No focal hepatic
lesion.  There multiple gallstones within the gallbladder lumen.
No gallbladder inflammation.  The pancreas, spleen, adrenal glands
are normal.  The stomach, small bowel, appendix, and cecum are
normal.  The colon rectosigmoid colon are normal.

Abdominal aorta normal caliber.  No retroperitoneal periportal
lymphadenopathy.

No free fluid the pelvis. No  distal ureteral stones or bladder
stones.  No filling defect in the bladder. Review of  bone windows
demonstrates no aggressive osseous lesions. Stable anterolisthesis
of L4-L5.
IMPRESSION: 1..  Low density lesion in the right renal cortex may represent a
benign complex or hemorrhagic cyst but cannot be characterized as a
nonenhancing on the study in part due to its small size.  The
lesion has increased in size from comparison exam therefore
recommend renal MRI without and with contrast.
2.  No nephrolithiasis, ureterolithiasis, or filling defect within
the collecting systems or ureters.
3.  Cholelithiasis without cholecystitis.

## 2015-08-02 ENCOUNTER — Other Ambulatory Visit: Payer: Self-pay | Admitting: Gastroenterology

## 2015-08-02 DIAGNOSIS — Z86010 Personal history of colonic polyps: Secondary | ICD-10-CM

## 2015-11-24 DIAGNOSIS — K5730 Diverticulosis of large intestine without perforation or abscess without bleeding: Secondary | ICD-10-CM | POA: Diagnosis not present

## 2015-11-24 DIAGNOSIS — Z86010 Personal history of colonic polyps: Secondary | ICD-10-CM | POA: Diagnosis not present

## 2015-12-05 DIAGNOSIS — H11153 Pinguecula, bilateral: Secondary | ICD-10-CM | POA: Diagnosis not present

## 2015-12-05 DIAGNOSIS — H353131 Nonexudative age-related macular degeneration, bilateral, early dry stage: Secondary | ICD-10-CM | POA: Diagnosis not present

## 2015-12-05 DIAGNOSIS — H2513 Age-related nuclear cataract, bilateral: Secondary | ICD-10-CM | POA: Diagnosis not present

## 2015-12-14 ENCOUNTER — Other Ambulatory Visit: Payer: Self-pay | Admitting: Gastroenterology

## 2015-12-14 DIAGNOSIS — K5730 Diverticulosis of large intestine without perforation or abscess without bleeding: Secondary | ICD-10-CM | POA: Diagnosis not present

## 2015-12-14 DIAGNOSIS — D125 Benign neoplasm of sigmoid colon: Secondary | ICD-10-CM | POA: Diagnosis not present

## 2015-12-14 DIAGNOSIS — Z86010 Personal history of colonic polyps: Secondary | ICD-10-CM | POA: Diagnosis not present

## 2016-01-09 DIAGNOSIS — E7800 Pure hypercholesterolemia, unspecified: Secondary | ICD-10-CM | POA: Diagnosis not present

## 2016-01-09 DIAGNOSIS — Z0000 Encounter for general adult medical examination without abnormal findings: Secondary | ICD-10-CM | POA: Diagnosis not present

## 2016-01-09 DIAGNOSIS — F419 Anxiety disorder, unspecified: Secondary | ICD-10-CM | POA: Diagnosis not present

## 2016-01-09 DIAGNOSIS — M8580 Other specified disorders of bone density and structure, unspecified site: Secondary | ICD-10-CM | POA: Diagnosis not present

## 2016-08-17 DIAGNOSIS — H6121 Impacted cerumen, right ear: Secondary | ICD-10-CM | POA: Diagnosis not present

## 2016-08-17 DIAGNOSIS — R040 Epistaxis: Secondary | ICD-10-CM | POA: Diagnosis not present

## 2017-06-07 ENCOUNTER — Observation Stay (HOSPITAL_COMMUNITY)
Admission: EM | Admit: 2017-06-07 | Discharge: 2017-06-08 | Disposition: A | Discharge status: Home / Self Care | Payer: BC Managed Care – PPO | Attending: Family Medicine | Admitting: Family Medicine

## 2017-06-07 ENCOUNTER — Emergency Department (HOSPITAL_COMMUNITY): Payer: BC Managed Care – PPO

## 2017-06-07 ENCOUNTER — Ambulatory Visit: Admit: 2017-06-07 | Payer: BC Managed Care – PPO | Admitting: Urology

## 2017-06-07 ENCOUNTER — Other Ambulatory Visit: Payer: Self-pay

## 2017-06-07 ENCOUNTER — Encounter (HOSPITAL_COMMUNITY): Payer: Self-pay | Admitting: Certified Registered Nurse Anesthetist

## 2017-06-07 ENCOUNTER — Encounter (HOSPITAL_COMMUNITY)
Admission: EM | Disposition: A | Discharge status: Home / Self Care | Payer: Self-pay | Source: Home / Self Care | Attending: Emergency Medicine

## 2017-06-07 ENCOUNTER — Encounter (HOSPITAL_COMMUNITY): Payer: Self-pay

## 2017-06-07 DIAGNOSIS — Z823 Family history of stroke: Secondary | ICD-10-CM | POA: Diagnosis not present

## 2017-06-07 DIAGNOSIS — N202 Calculus of kidney with calculus of ureter: Secondary | ICD-10-CM | POA: Diagnosis not present

## 2017-06-07 DIAGNOSIS — R9431 Abnormal electrocardiogram [ECG] [EKG]: Secondary | ICD-10-CM | POA: Diagnosis not present

## 2017-06-07 DIAGNOSIS — Z881 Allergy status to other antibiotic agents status: Secondary | ICD-10-CM | POA: Diagnosis not present

## 2017-06-07 DIAGNOSIS — N1330 Unspecified hydronephrosis: Secondary | ICD-10-CM

## 2017-06-07 DIAGNOSIS — N179 Acute kidney failure, unspecified: Secondary | ICD-10-CM | POA: Diagnosis not present

## 2017-06-07 DIAGNOSIS — N132 Hydronephrosis with renal and ureteral calculous obstruction: Secondary | ICD-10-CM | POA: Diagnosis not present

## 2017-06-07 DIAGNOSIS — Z9889 Other specified postprocedural states: Secondary | ICD-10-CM | POA: Diagnosis not present

## 2017-06-07 DIAGNOSIS — Z87442 Personal history of urinary calculi: Secondary | ICD-10-CM | POA: Insufficient documentation

## 2017-06-07 DIAGNOSIS — Z8249 Family history of ischemic heart disease and other diseases of the circulatory system: Secondary | ICD-10-CM | POA: Insufficient documentation

## 2017-06-07 DIAGNOSIS — E860 Dehydration: Secondary | ICD-10-CM | POA: Insufficient documentation

## 2017-06-07 DIAGNOSIS — Z79899 Other long term (current) drug therapy: Secondary | ICD-10-CM | POA: Diagnosis not present

## 2017-06-07 DIAGNOSIS — Z90710 Acquired absence of both cervix and uterus: Secondary | ICD-10-CM | POA: Diagnosis not present

## 2017-06-07 DIAGNOSIS — N200 Calculus of kidney: Secondary | ICD-10-CM

## 2017-06-07 DIAGNOSIS — F419 Anxiety disorder, unspecified: Secondary | ICD-10-CM | POA: Diagnosis not present

## 2017-06-07 HISTORY — DX: Calculus of kidney: N20.0

## 2017-06-07 LAB — URINALYSIS, ROUTINE W REFLEX MICROSCOPIC
BACTERIA UA: NONE SEEN
BILIRUBIN URINE: NEGATIVE
Glucose, UA: NEGATIVE mg/dL
KETONES UR: 20 mg/dL — AB
Leukocytes, UA: NEGATIVE
Nitrite: NEGATIVE
Protein, ur: NEGATIVE mg/dL
SPECIFIC GRAVITY, URINE: 1.023 (ref 1.005–1.030)
pH: 5 (ref 5.0–8.0)

## 2017-06-07 LAB — I-STAT CHEM 8, ED
BUN: 24 mg/dL — AB (ref 6–20)
CREATININE: 1.2 mg/dL — AB (ref 0.44–1.00)
Calcium, Ion: 1.17 mmol/L (ref 1.15–1.40)
Chloride: 101 mmol/L (ref 101–111)
GLUCOSE: 123 mg/dL — AB (ref 65–99)
HEMATOCRIT: 39 % (ref 36.0–46.0)
Hemoglobin: 13.3 g/dL (ref 12.0–15.0)
Potassium: 3.8 mmol/L (ref 3.5–5.1)
Sodium: 140 mmol/L (ref 135–145)
TCO2: 30 mmol/L (ref 22–32)

## 2017-06-07 LAB — COMPREHENSIVE METABOLIC PANEL
ALBUMIN: 4.4 g/dL (ref 3.5–5.0)
ALT: 16 U/L (ref 14–54)
ANION GAP: 10 (ref 5–15)
AST: 26 U/L (ref 15–41)
Alkaline Phosphatase: 87 U/L (ref 38–126)
BUN: 26 mg/dL — ABNORMAL HIGH (ref 6–20)
CHLORIDE: 105 mmol/L (ref 101–111)
CO2: 27 mmol/L (ref 22–32)
Calcium: 9.8 mg/dL (ref 8.9–10.3)
Creatinine, Ser: 1.42 mg/dL — ABNORMAL HIGH (ref 0.44–1.00)
GFR calc non Af Amer: 34 mL/min — ABNORMAL LOW (ref >60)
GFR, EST AFRICAN AMERICAN: 40 mL/min — AB (ref >60)
GLUCOSE: 128 mg/dL — AB (ref 65–99)
POTASSIUM: 4.4 mmol/L (ref 3.5–5.1)
Sodium: 142 mmol/L (ref 135–145)
Total Bilirubin: 1.5 mg/dL — ABNORMAL HIGH (ref 0.3–1.2)
Total Protein: 7.6 g/dL (ref 6.5–8.1)

## 2017-06-07 LAB — CBC WITH DIFFERENTIAL/PLATELET
Basophils Absolute: 0 10*3/uL (ref 0.0–0.1)
Basophils Relative: 0 %
Eosinophils Absolute: 0 10*3/uL (ref 0.0–0.7)
Eosinophils Relative: 0 %
HEMATOCRIT: 39.6 % (ref 36.0–46.0)
HEMOGLOBIN: 13.1 g/dL (ref 12.0–15.0)
LYMPHS ABS: 0.9 10*3/uL (ref 0.7–4.0)
LYMPHS PCT: 6 %
MCH: 31.1 pg (ref 26.0–34.0)
MCHC: 33.1 g/dL (ref 30.0–36.0)
MCV: 94.1 fL (ref 78.0–100.0)
MONOS PCT: 4 %
Monocytes Absolute: 0.6 10*3/uL (ref 0.1–1.0)
NEUTROS ABS: 13.4 10*3/uL — AB (ref 1.7–7.7)
NEUTROS PCT: 90 %
Platelets: 253 10*3/uL (ref 150–400)
RBC: 4.21 MIL/uL (ref 3.87–5.11)
RDW: 13 % (ref 11.5–15.5)
WBC: 14.9 10*3/uL — ABNORMAL HIGH (ref 4.0–10.5)

## 2017-06-07 SURGERY — CYSTOSCOPY/URETEROSCOPY/HOLMIUM LASER/STENT PLACEMENT
Anesthesia: General | Laterality: Right

## 2017-06-07 MED ORDER — POLYETHYLENE GLYCOL 3350 17 G PO PACK
17.0000 g | PACK | Freq: Two times a day (BID) | ORAL | Status: DC
  Administered 2017-06-07: 17 g via ORAL
  Filled 2017-06-07: qty 1

## 2017-06-07 MED ORDER — IOPAMIDOL (ISOVUE-300) INJECTION 61%
INTRAVENOUS | Status: AC
  Administered 2017-06-07: 100 mL
  Filled 2017-06-07: qty 100

## 2017-06-07 MED ORDER — SODIUM CHLORIDE 0.9% FLUSH: 3.0000 mL | Freq: Two times a day (BID) | INTRAVENOUS | Status: DC

## 2017-06-07 MED ORDER — SODIUM CHLORIDE 0.9 % IV BOLUS (SEPSIS)
1000.0000 mL | Freq: Once | INTRAVENOUS | Status: AC
  Administered 2017-06-07: 1000 mL via INTRAVENOUS

## 2017-06-07 MED ORDER — ONDANSETRON HCL 4 MG/2ML IJ SOLN: 4.0000 mg | Freq: Four times a day (QID) | INTRAMUSCULAR | Status: DC | PRN

## 2017-06-07 MED ORDER — MORPHINE SULFATE (PF) 4 MG/ML IV SOLN: 2.0000 mg | INTRAVENOUS | Status: DC | PRN

## 2017-06-07 MED ORDER — ACETAMINOPHEN 325 MG PO TABS: 650.0000 mg | ORAL_TABLET | Freq: Four times a day (QID) | ORAL | Status: DC | PRN

## 2017-06-07 MED ORDER — DIAZEPAM 2 MG PO TABS: 2.0000 mg | ORAL_TABLET | Freq: Two times a day (BID) | ORAL | Status: DC | PRN

## 2017-06-07 MED ORDER — TAMSULOSIN HCL 0.4 MG PO CAPS
0.4000 mg | ORAL_CAPSULE | Freq: Once | ORAL | Status: AC
  Administered 2017-06-07: 0.4 mg via ORAL
  Filled 2017-06-07: qty 1

## 2017-06-07 MED ORDER — MORPHINE SULFATE (PF) 4 MG/ML IV SOLN
4.0000 mg | Freq: Once | INTRAVENOUS | Status: AC
  Administered 2017-06-07: 4 mg via INTRAVENOUS
  Filled 2017-06-07: qty 1

## 2017-06-07 MED ORDER — ACETAMINOPHEN 650 MG RE SUPP: 650.0000 mg | Freq: Four times a day (QID) | RECTAL | Status: DC | PRN

## 2017-06-07 MED ORDER — ONDANSETRON HCL 4 MG PO TABS: 4.0000 mg | ORAL_TABLET | Freq: Four times a day (QID) | ORAL | Status: DC | PRN

## 2017-06-07 MED ORDER — ONDANSETRON HCL 4 MG/2ML IJ SOLN
4.0000 mg | Freq: Once | INTRAMUSCULAR | Status: AC
  Administered 2017-06-07: 4 mg via INTRAVENOUS
  Filled 2017-06-07: qty 2

## 2017-06-07 MED ORDER — OXYCODONE HCL 5 MG PO TABS: 5.0000 mg | ORAL_TABLET | ORAL | Status: DC | PRN

## 2017-06-07 MED ORDER — SODIUM CHLORIDE 0.9 % IV SOLN
1000.0000 mL | INTRAVENOUS | Status: DC
Start: 2017-06-07 — End: 2017-06-08
  Administered 2017-06-07 (×2): 1000 mL via INTRAVENOUS

## 2017-06-07 NOTE — ED Triage Notes (Signed)
Pt reports 8/10 right sided flank pain that radiates into her groin and nausea. Pt reports hx of kidney stones. Pt denies polyuria. Pt A+OX4, speaking in complete sentences, ambulatory independently to triage.

## 2017-06-07 NOTE — Care Management Note (Signed)
Case Management Note  Patient Details  Name: Vanessa Cannon MRN: [id number] Date of Birth: 04/10/1936  Subjective/Objective: 81 y/o f admitted w/R UVJ calculus. From home.                    Action/Plan:d/c plan home.   Expected Discharge Date:  (UNKNOWN)               Expected Discharge Plan:  Home/Self Care  In-House Referral:     Discharge planning Services  CM Consult  Post Acute Care Choice:    Choice offered to:     DME Arranged:    DME Agency:     HH Arranged:    HH Agency:     Status of Service:  In process, will continue to follow  If discussed at Long Length of Stay Meetings, dates discussed:    Additional Comments:  Dessa Phi, RN 06/07/2017, 2:23 PM

## 2017-06-07 NOTE — Discharge Instructions (Signed)
You have some pulmonary nodules noted on your abdominal Ct. According to the radiologist, These can be followed by

## 2017-06-07 NOTE — ED Notes (Signed)
ED TO INPATIENT HANDOFF REPORT  Name/Age/Gender Vanessa Cannon 81 y.o. female  Code Status Code Status History    This patient does not have a recorded code status. Please follow your organizational policy for patients in this situation.      Home/SNF/Other Home  Chief Complaint flank pain   Level of Care/Admitting Diagnosis ED Disposition    ED Disposition Condition Comment   Admit  Hospital Area: [facility] [[id number]]  Level of Care: Med-Surg [16]  Diagnosis: Nephrolithiasis [219758]  Admitting Physician: Samuella Cota [[id number]]  Attending Physician: Samuella Cota [[id number]]  PT Class (Do Not Modify): Observation [104]  PT Acc Code (Do Not Modify): Observation [10022]       Medical History Past Medical History:  Diagnosis Date  . Anxiety   . Heart murmur    murmur as child  . Renal calculi   . Sebaceous cyst    on neck    Allergies Allergies  Allergen Reactions  . Ceclor [Cefaclor] Rash    IV Location/Drains/Wounds Patient Lines/Drains/Airways Status   Active Line/Drains/Airways    Name:   Placement date:   Placement time:   Site:   Days:   Peripheral IV 06/07/17 Left Antecubital   06/07/17    0740    Antecubital   less than 1   Incision (Closed) 03/07/15 Neck Right   03/07/15    1014     823          Labs/Imaging Results for orders placed or performed during the hospital encounter of 06/07/17 (from the past 48 hour(s))  Urinalysis, Routine w reflex microscopic- may I&O cath if menses     Status: Abnormal   Collection Time: 06/07/17  4:45 AM  Result Value Ref Range   Color, Urine YELLOW YELLOW   APPearance HAZY (A) CLEAR   Specific Gravity, Urine 1.023 1.005 - 1.030   pH 5.0 5.0 - 8.0   Glucose, UA NEGATIVE NEGATIVE mg/dL   Hgb urine dipstick LARGE (A) NEGATIVE   Bilirubin Urine NEGATIVE NEGATIVE   Ketones, ur 20 (A) NEGATIVE mg/dL   Protein, ur NEGATIVE NEGATIVE mg/dL   Nitrite NEGATIVE NEGATIVE   Leukocytes, UA  NEGATIVE NEGATIVE   RBC / HPF TOO NUMEROUS TO COUNT 0 - 5 RBC/hpf   WBC, UA 6-30 0 - 5 WBC/hpf   Bacteria, UA NONE SEEN NONE SEEN   Squamous Epithelial / LPF 0-5 (A) NONE SEEN   Mucus PRESENT    Hyaline Casts, UA PRESENT    Ca Oxalate Crys, UA PRESENT   Comprehensive metabolic panel     Status: Abnormal   Collection Time: 06/07/17  7:38 AM  Result Value Ref Range   Sodium 142 135 - 145 mmol/L   Potassium 4.4 3.5 - 5.1 mmol/L   Chloride 105 101 - 111 mmol/L   CO2 27 22 - 32 mmol/L   Glucose, Bld 128 (H) 65 - 99 mg/dL   BUN 26 (H) 6 - 20 mg/dL   Creatinine, Ser 1.42 (H) 0.44 - 1.00 mg/dL   Calcium 9.8 8.9 - 10.3 mg/dL   Total Protein 7.6 6.5 - 8.1 g/dL   Albumin 4.4 3.5 - 5.0 g/dL   AST 26 15 - 41 U/L   ALT 16 14 - 54 U/L   Alkaline Phosphatase 87 38 - 126 U/L   Total Bilirubin 1.5 (H) 0.3 - 1.2 mg/dL   GFR calc non Af Amer 34 (L) >60 mL/min   GFR calc Af Wyvonnia Lora  40 (L) >60 mL/min    Comment: (NOTE) The eGFR has been calculated using the CKD EPI equation. This calculation has not been validated in all clinical situations. eGFR's persistently <60 mL/min signify possible Chronic Kidney Disease.    Anion gap 10 5 - 15  CBC with Differential     Status: Abnormal   Collection Time: 06/07/17  7:38 AM  Result Value Ref Range   WBC 14.9 (H) 4.0 - 10.5 K/uL   RBC 4.21 3.87 - 5.11 MIL/uL   Hemoglobin 13.1 12.0 - 15.0 g/dL   HCT 39.6 36.0 - 46.0 %   MCV 94.1 78.0 - 100.0 fL   MCH 31.1 26.0 - 34.0 pg   MCHC 33.1 30.0 - 36.0 g/dL   RDW 13.0 11.5 - 15.5 %   Platelets 253 150 - 400 K/uL   Neutrophils Relative % 90 %   Neutro Abs 13.4 (H) 1.7 - 7.7 K/uL   Lymphocytes Relative 6 %   Lymphs Abs 0.9 0.7 - 4.0 K/uL   Monocytes Relative 4 %   Monocytes Absolute 0.6 0.1 - 1.0 K/uL   Eosinophils Relative 0 %   Eosinophils Absolute 0.0 0.0 - 0.7 K/uL   Basophils Relative 0 %   Basophils Absolute 0.0 0.0 - 0.1 K/uL  I-stat Chem 8, ED     Status: Abnormal   Collection Time: 06/07/17  8:10  AM  Result Value Ref Range   Sodium 140 135 - 145 mmol/L   Potassium 3.8 3.5 - 5.1 mmol/L   Chloride 101 101 - 111 mmol/L   BUN 24 (H) 6 - 20 mg/dL   Creatinine, Ser 1.20 (H) 0.44 - 1.00 mg/dL   Glucose, Bld 123 (H) 65 - 99 mg/dL   Calcium, Ion 1.17 1.15 - 1.40 mmol/L   TCO2 30 22 - 32 mmol/L   Hemoglobin 13.3 12.0 - 15.0 g/dL   HCT 39.0 36.0 - 46.0 %   Dg Abdomen 1 View  Result Date: 06/07/2017 CLINICAL DATA:  Right-sided kidney stone. EXAM: ABDOMEN - 1 VIEW COMPARISON:  CT abdomen and pelvis from the same day. FINDINGS: The bowel gas pattern is normal. Moderate right-sided hydronephrosis is present with asymmetric contrast in the right renal collecting system. The stone is opacified by in the contrast in the urinary bladder. Multiple gallstones are again seen. Degenerative changes are present in the lumbar spine. IMPRESSION: 1. Moderate right-sided hydronephrosis with opacification of the collecting system. 2. The distal right ureteral stone is obscured by bladder contrast. 3. Cholelithiasis. Electronically Signed   By: San Morelle M.D.   On: 06/07/2017 11:45   Ct Abdomen Pelvis W Contrast  Result Date: 06/07/2017 CLINICAL DATA:  Right flank and groin pain with nausea. History of kidney stones. Appendicitis suspected. EXAM: CT ABDOMEN AND PELVIS WITH CONTRAST TECHNIQUE: Multidetector CT imaging of the abdomen and pelvis was performed using the standard protocol following bolus administration of intravenous contrast. CONTRAST:  14m ISOVUE-300 IOPAMIDOL (ISOVUE-300) INJECTION 61% COMPARISON:  Abdominopelvic CT 12/09/2012. FINDINGS: Lower chest: Scattered small subpleural nodules at both lung bases are similar to the previous study. There is a calcified left lower lobe granuloma on image 33. No significant pleural or pericardial effusion. Hepatobiliary: There are scattered tiny hepatic cysts. No suspicious hepatic lesions. Multiple small gallstones are present. There is no gallbladder  wall thickening or biliary dilatation. Pancreas: Unremarkable. No pancreatic ductal dilatation or surrounding inflammatory changes. Spleen: Normal in size without focal abnormality. Adrenals/Urinary Tract: Stable mild fullness of the left adrenal  gland without focal nodule. The right adrenal gland appears normal. There is a tiny nonobstructing calculus in the lower pole of the right kidney. There is a 3 mm obstructing calculus at the right ureterovesical junction (image 63). The right kidney demonstrates hydronephrosis and mildly delayed contrast excretion. There is mild asymmetric perinephric soft tissue stranding on the right. Small low-density renal lesions bilaterally are stable and consistent with cysts. The bladder appears unremarkable. Stomach/Bowel: No evidence of bowel wall thickening, distention or surrounding inflammatory change. The appendix appears normal. Mild sigmoid diverticulosis. Vascular/Lymphatic: There are no enlarged abdominal or pelvic lymph nodes. Minimal aortic atherosclerosis. Retroaortic left renal vein. Reproductive: Hysterectomy.  No adnexal mass. Other: Small umbilical hernia containing only fat.  No ascites. Musculoskeletal: No acute or significant osseous findings. There are degenerative changes in the spine with a stable convex left scoliosis and degenerative anterolisthesis at L4-5. IMPRESSION: 1. No evidence of appendicitis. 2. Obstructing 3 mm calculus at the right ureterovesical junction. 3. Small right renal calculus, cholelithiasis and stable renal cystic lesions bilaterally. Small umbilical hernia containing only fat. Electronically Signed   By: Richardean Sale M.D.   On: 06/07/2017 09:39    Pending Labs Unresulted Labs (From admission, onward)   Start     Ordered   06/07/17 0653  Urine culture  STAT,   STAT     06/07/17 2162   Signed and Held  Basic metabolic panel  Tomorrow morning,   R     Signed and Held   Signed and Held  CBC  Tomorrow morning,   R     Signed  and Held      Vitals/Pain Today's Vitals   06/07/17 0900 06/07/17 1100 06/07/17 1200 06/07/17 1225  BP: (!) 126/54 132/60 (!) 134/55   Pulse: 73 83 77   Resp: 13 20 13    Temp:      TempSrc:      SpO2: 94% 95% 95%   Weight:      Height:      PainSc:    2     Isolation Precautions No active isolations  Medications Medications  sodium chloride 0.9 % bolus 1,000 mL (0 mLs Intravenous Stopped 06/07/17 1030)    Followed by  0.9 %  sodium chloride infusion (1,000 mLs Intravenous New Bag/Given 06/07/17 1226)  morphine 4 MG/ML injection 4 mg (4 mg Intravenous Given 06/07/17 0751)  sodium chloride 0.9 % bolus 1,000 mL (1,000 mLs Intravenous New Bag/Given 06/07/17 0747)  ondansetron (ZOFRAN) injection 4 mg (4 mg Intravenous Given 06/07/17 0751)  iopamidol (ISOVUE-300) 61 % injection (100 mLs  Contrast Given 06/07/17 0911)  tamsulosin (FLOMAX) capsule 0.4 mg (0.4 mg Oral Given 06/07/17 1226)    Mobility {Mobility:20148

## 2017-06-07 NOTE — Progress Notes (Signed)
Upon arrival to the unit, patient voided and urine was strained. Small stone was noted in the strainer. Urology MD made aware. Plans to come see patient to evaluate.

## 2017-06-07 NOTE — ED Provider Notes (Signed)
[facility] DEPT Provider Note   CSN: [id number] Arrival date & time: 06/07/17  0442     History   Chief Complaint Chief Complaint  Patient presents with  . Flank Pain    Right    HPI Vanessa Cannon is a 81 y.o. female.  HPI  Patient is a 81 year old female with a history of anxiety and nephrolithiasis, and abdominal surgical history significant for abdominal hysterectomy presenting for right lower quadrant pain.  Patient reports that this began Wednesday evening with emesis that developed into right lateral abdomen and right lower quadrant pain.  Patient reports that the pain has been worsening and constant.  Patient has had minimal p.o. intake the last 48 hours due to emesis and nausea.  No hematemesis or coffee-ground emesis.  Last bowel movement yesterday and loose without melena or hematochezia.  Patient denies fever or chills.  No dysuria, abnormal vaginal discharge, or vaginal bleeding.  Patient attempted taking diazepam this morning for anxiety and pain without relief.  Of note, patient does report that she has a history of hematuria that routinely presents on her urinalysis.  Past Medical History:  Diagnosis Date  . Anxiety   . Heart murmur    murmur as child  . Renal calculi   . Sebaceous cyst    on neck    There are no active problems to display for this patient.   Past Surgical History:  Procedure Laterality Date  . ABDOMINAL HYSTERECTOMY    . EAR CYST EXCISION Right 03/07/2015   Procedure: CYST EXCISION RIGHT POSTERIOR NECK;  Surgeon: Coralie Keens, MD;  Location: [facility];  Service: General;  Laterality: Right;  . TONSILLECTOMY      OB History    No data available       Home Medications    Prior to Admission medications   Medication Sig Start Date End Date Taking? Authorizing Provider  albuterol (PROVENTIL HFA;VENTOLIN HFA) 108 (90 Base) MCG/ACT inhaler Inhale 1-2 puffs into the lungs every 6 (six)  hours as needed for wheezing or shortness of breath.   Yes [provider]  cholecalciferol (VITAMIN D) 1000 UNITS tablet Take 1,000 Units by mouth daily.   Yes [provider]  diazepam (VALIUM) 5 MG tablet Take 5 mg by mouth every 6 (six) hours as needed for anxiety.   Yes [provider]  Multiple Vitamin (MULTIVITAMIN WITH MINERALS) TABS tablet Take 1 tablet by mouth daily.   Yes [provider]  Omega-3 Fatty Acids (FISH OIL) 1000 MG CAPS Take by mouth.   Yes [provider]  pyridOXINE (B-6) 50 MG tablet Take 50 mg by mouth daily.   Yes [provider]    Family History History reviewed. No pertinent family history.  Social History Social History   Tobacco Use  . Smoking status: Never Smoker  . Smokeless tobacco: Never Used  Substance Use Topics  . Alcohol use: Yes    Comment: social  . Drug use: No     Allergies   Ceclor [cefaclor]   Review of Systems Review of Systems  Constitutional: Negative for chills and fever.  HENT: Negative for congestion.   Eyes: Negative for visual disturbance.  Respiratory: Negative for cough, chest tightness and shortness of breath.   Cardiovascular: Negative for chest pain and leg swelling.  Gastrointestinal: Positive for abdominal pain, nausea and vomiting. Negative for blood in stool, constipation and diarrhea.  Genitourinary: Positive for flank pain. Negative for dysuria, vaginal  bleeding and vaginal discharge.  Musculoskeletal: Negative for back pain and myalgias.  Skin: Negative for rash.  Neurological: Negative for dizziness, syncope, light-headedness and headaches.     Physical Exam Updated Vital Signs BP (!) 147/53   Pulse 70   Temp (!) 97.5 F (36.4 C) (Oral)   Resp 11   Ht 5\' 3"  (1.6 m)   Wt 59.9 kg (132 lb 0.9 oz)   SpO2 98%   BMI 23.39 kg/m   Physical Exam  Constitutional: She appears well-developed and well-nourished. No distress.  HENT:  Head:  Normocephalic and atraumatic.  Mouth/Throat: Oropharynx is clear and moist.  Eyes: Conjunctivae and EOM are normal. Pupils are equal, round, and reactive to light.  Neck: Normal range of motion. Neck supple.  Cardiovascular: Normal rate, regular rhythm, S1 normal and S2 normal.  No murmur heard. Pulmonary/Chest: Effort normal and breath sounds normal. She has no wheezes. She has no rales.  Abdominal: Soft. Bowel sounds are normal. She exhibits no distension. There is tenderness. There is no guarding.  Bowel sounds are normal and active in all 4 quadrants and epigastrium.  There is tenderness to palpation of the right lower quadrant without rebound.  There is no suprapubic or left lower quadrant tenderness.  No CVA tenderness.  Musculoskeletal: Normal range of motion. She exhibits no edema or deformity.  Lymphadenopathy:    She has no cervical adenopathy.  Neurological: She is alert.  Cranial nerves grossly intact. Patient was extremities symmetrically and with good coordination.  Skin: Skin is warm and dry. No rash noted. No erythema.  Psychiatric: She has a normal mood and affect. Her behavior is normal. Judgment and thought content normal.  Nursing note and vitals reviewed.    ED Treatments / Results  Labs (all labs ordered are listed, but only abnormal results are displayed) Labs Reviewed  URINALYSIS, ROUTINE W REFLEX MICROSCOPIC - Abnormal; Notable for the following components:      Result Value   APPearance HAZY (*)    Hgb urine dipstick LARGE (*)    Ketones, ur 20 (*)    Squamous Epithelial / LPF 0-5 (*)    All other components within normal limits  URINE CULTURE  COMPREHENSIVE METABOLIC PANEL  CBC WITH DIFFERENTIAL/PLATELET  I-STAT CHEM 8, ED    EKG  EKG Interpretation  Date/Time:  Friday June 07 2017 06:51:57 EST Ventricular Rate:  69 PR Interval:    QRS Duration: 91 QT Interval:  408 QTC Calculation: 438 R Axis:   -51 Text Interpretation:  Sinus rhythm  Left anterior fascicular block Rate is slower Confirmed by Molpus, John [phone]) on 06/07/2017 6:59:17 AM       Radiology No results found.  Procedures Procedures (including critical care time)  Medications Ordered in ED Medications  morphine 4 MG/ML injection 4 mg (not administered)  sodium chloride 0.9 % bolus 1,000 mL (not administered)  ondansetron (ZOFRAN) injection 4 mg (not administered)     Initial Impression / Assessment and Plan / ED Course  I have reviewed the triage vital signs and the nursing notes.  Pertinent labs & imaging results that were available during my care of the patient were reviewed by me and considered in my medical decision making (see chart for details).     Final Clinical Impressions(s) / ED Diagnoses   Final diagnoses:  Nephrolithiasis   Patient is nontoxic-appearing, afebrile, but uncomfortable.  Differential diagnosis includes nephrolithiasis, appendicitis, right-sided diverticulitis, urinary tract infection, pyelonephritis.  Will initiate UA, CBC, CMP,  and CT of the abdomen and pelvis with contrast.  Morphine and Zofran for pain and nausea.  On reevaluation, patient has improvement of pain and pain is controlled. Patient updated on labs so far and all questions answered.   Spoke with Dr. Gloriann Loan of Urology and stated that outpatient therapy is reasonable if patient is agreeable to close follow up and outpatient pain control.  I discussed this with patient.  Patient is reticent to attempt outpatient therapy due to living at home alone and having minimal support for anyone to check on her.  I feel that inpatient observation overnight would be reasonable for hydration, pain and nausea control.   Spoke with Dr. Sarajane Jews of [facility] regarding overnight observation for pain control, nausea, vomiting, and elevated renal function.  Will admit patient requests urology consult.    Spoke with Dr. Gloriann Loan in neurology who will come and evaluate the  patient and present surgical versus nonsurgical management of this episode of nephrolithiasis.  Patient to be n.p.o and receive KUB at this time. Patient updated and is in agreement with the plan of care.  This is a shared visit with Dr. Orvilla Cornwall. Patient was independently evaluated by this attending physician. Attending physician consulted in evaluation and admission management.  ED Discharge Orders    None       Tamala Julian 06/07/17 1737    Molpus, Jenny Reichmann, MD 06/07/17 2246

## 2017-06-07 NOTE — H&P (Signed)
History and Physical  Vanessa Cannon UUV:[id number] DOB: July 28, 1935 DOA: 06/07/2017  PCP: [name], MD  Patient coming from: home  Chief Complaint: abd pain  HPI:  54yow no significant PMH except nephrolithiasis presented to ED with ongoing RLQ pain, nausea, previous vomiting, ongoing poor appetite and limited fluid intake. Imaging revealed obstructing stone at right UVJ and AKI/dehydration.  Patient with sudden onset RLQ pain 12/12, 5/10 intensity, no specific aggravating or alleviating factors, described as an "ache". Associated with vomiting (none today) and ongoing poor oral intake.  ED Course: afebrile, VSS, treated with   Zofran, morphine, OVF  Review of Systems:  Negative for fever, chills, sweats, no visual changes, sore throat, rash, new muscle aches, chest pain, SOB, dysuria, bleeding  Positive for vomiting, RLQ abd pain  Past Medical History:  Diagnosis Date  . Anxiety   . Heart murmur    murmur as child  . Renal calculi   . Sebaceous cyst    on neck    Past Surgical History:  Procedure Laterality Date  . ABDOMINAL HYSTERECTOMY    . EAR CYST EXCISION Right 03/07/2015   Procedure: CYST EXCISION RIGHT POSTERIOR NECK;  Surgeon: Coralie Keens, MD;  Location: [facility];  Service: General;  Laterality: Right;  . TONSILLECTOMY    . uro stent       reports that  has never smoked. she has never used smokeless tobacco. She reports that she drinks alcohol. She reports that she does not use drugs. Mobility: ambulatory  Allergies  Allergen Reactions  . Ceclor [Cefaclor] Rash    Family History  Problem Relation Age of Onset  . Heart failure Mother   . Stroke Sister      Prior to Admission medications   Medication Sig Start Date End Date Taking? Authorizing Provider  albuterol (PROVENTIL HFA;VENTOLIN HFA) 108 (90 Base) MCG/ACT inhaler Inhale 1-2 puffs into the lungs every 6 (six) hours as needed for wheezing or shortness of breath.   Yes  [provider]  cholecalciferol (VITAMIN D) 1000 UNITS tablet Take 1,000 Units by mouth daily.   Yes [provider]  diazepam (VALIUM) 5 MG tablet Take 5 mg by mouth every 6 (six) hours as needed for anxiety.   Yes [provider]  Multiple Vitamin (MULTIVITAMIN WITH MINERALS) TABS tablet Take 1 tablet by mouth daily.   Yes [provider]  Omega-3 Fatty Acids (FISH OIL) 1000 MG CAPS Take by mouth.   Yes [provider]  pyridOXINE (B-6) 50 MG tablet Take 50 mg by mouth daily.   Yes [provider]    Physical Exam:  Constitutional:   Appears calm and comfortable Eyes:  pupils and irises appear normal Normal lids  ENMT:  grossly normal hearing  Lips appear normal Neck:  neck appears normal, no masses no thyromegaly Respiratory:  CTA bilaterally, no w/r/r.  Respiratory effort normal.  Cardiovascular:  RRR, no m/r/g No LE extremity edema   Abdomen:  Abdomen appears normal; no tenderness or masses No hernias Musculoskeletal:  Digits/nails BUE: no clubbing, cyanosis, petechiae, infection RUE, LUE, RLE, LLE   strength and tone normal, no atrophy, no abnormal movements Skin:  No rashes, lesions, ulcers palpation of skin: no induration or nodules Psychiatric:  Mental status Mood, affect appropriate judgement and insight appear normal   Wt Readings from Last 3 Encounters:  06/07/17 59.9 kg (132 lb 0.9 oz)  03/07/15 55.8 kg (123 lb)    I have personally reviewed following labs  and imaging studies  Labs:   Creatinine 1.43 (baseline 0.8-1)  WBC 14.9  U/A hematuria  Imaging studies:   CT abd/pelvis noted  AXR moderate right-sided hydronephrosis  Medical tests:   EKG independently reviewed: SR no acute changes     Principal Problem:   Hydronephrosis with renal and ureteral calculus obstruction Active Problems:   Nephrolithiasis   AKI (acute kidney injury) ([place])   Assessment/Plan Right UVJ calculus,  with obstruction, with right-sided hydronephrosis and AKI. No signs/symptoms of infection. - IVF, antiemetics, analgesics, supportive care - urology consulted by EDP  AKI vs dehydration - secondary to hydronephrosis/poor oral intake - IVF, check BMP in AM   Severity of Illness: The appropriate patient status for this patient is OBSERVATION. Observation status is judged to be reasonable and necessary in order to provide the required intensity of service to ensure the patient's safety. The patient's presenting symptoms, physical exam findings, and initial radiographic and laboratory data in the context of their medical condition is felt to place them at decreased risk for further clinical deterioration. Furthermore, it is anticipated that the patient will be medically stable for discharge from the hospital within 2 midnights of admission. The following factors support the patient status of observation.   " The patient's presenting symptoms include RLQ pain. " The physical exam findings include RLQ pain. " The initial radiographic and laboratory data are right UVJ stone with obstruction, hydronephrosis and AKI.  DVT prophylaxis: SCDs Code Status: full Family Communication: none Consults called: urology by ED    Time spent: 50 minutes  Murray Hodgkins, MD  [facility] Direct contact: [phone] --[place]ia [place]  --[email or web address]; password [email or web address]  7PM-7AM contact night coverage as above  06/07/2017, 2:07 PM

## 2017-06-07 NOTE — ED Notes (Signed)
ADMISSION MD Provider at bedside. 

## 2017-06-07 NOTE — ED Notes (Signed)
Vanessa Cannon

## 2017-06-07 NOTE — Consult Note (Signed)
H&P Physician requesting consult: Murray Hodgkins  Chief Complaint: right ureteral calculus  History of Present Illness: 81 year old female with a history of nephrolithiasis presented with acute right sided abdominal pain and a KI.  Her renal function improved some after a bolus.  She continues to have some mild persistent pain.  Urinalysis does not suggest urinary tract infection.she has mild leukocytosis but is afebrile with vital signs stable.  Past Medical History:  Diagnosis Date  . Anxiety   . Heart murmur    murmur as child  . Renal calculi   . Sebaceous cyst    on neck   Past Surgical History:  Procedure Laterality Date  . ABDOMINAL HYSTERECTOMY    . EAR CYST EXCISION Right 03/07/2015   Procedure: CYST EXCISION RIGHT POSTERIOR NECK;  Surgeon: Coralie Keens, MD;  Location: [facility];  Service: General;  Laterality: Right;  . TONSILLECTOMY    . uro stent      Home Medications:  Medications Prior to Admission  Medication Sig Dispense Refill Last Dose  . albuterol (PROVENTIL HFA;VENTOLIN HFA) 108 (90 Base) MCG/ACT inhaler Inhale 1-2 puffs into the lungs every 6 (six) hours as needed for wheezing or shortness of breath.   5 years ago  . cholecalciferol (VITAMIN D) 1000 UNITS tablet Take 1,000 Units by mouth daily.   Past Week at Unknown time  . diazepam (VALIUM) 5 MG tablet Take 5 mg by mouth every 6 (six) hours as needed for anxiety.   06/07/2017 at Unknown time  . Multiple Vitamin (MULTIVITAMIN WITH MINERALS) TABS tablet Take 1 tablet by mouth daily.   Past Week at Unknown time  . Omega-3 Fatty Acids (FISH OIL) 1000 MG CAPS Take by mouth.   Past Week at Unknown time  . pyridOXINE (B-6) 50 MG tablet Take 50 mg by mouth daily.   Past Week at Unknown time   Allergies:  Allergies  Allergen Reactions  . Ceclor [Cefaclor] Rash    Family History  Problem Relation Age of Onset  . Heart failure Mother   . Stroke Sister    Social History:  reports that  has  never smoked. she has never used smokeless tobacco. She reports that she drinks alcohol. She reports that she does not use drugs.  ROS: A complete review of systems was performed.  All systems are negative except for pertinent findings as noted. ROS   Physical Exam:  Vital signs in last 24 hours: Temp:  [97.5 F (36.4 C)-98.2 F (36.8 C)] 98.2 F (36.8 C) (12/14 1340) Pulse Rate:  [66-86] 85 (12/14 1340) Resp:  [11-20] 16 (12/14 1340) BP: (120-160)/(53-96) 120/96 (12/14 1340) SpO2:  [94 %-98 %] 96 % (12/14 1340) Weight:  [59.9 kg (132 lb 0.9 oz)] 59.9 kg (132 lb 0.9 oz) (12/14 0546) General:  Alert and oriented, No acute distress HEENT: Normocephalic, atraumatic Neck: No JVD or lymphadenopathy Cardiovascular: Regular rate and rhythm Lungs: Regular rate and effort Abdomen: Soft, nontender, nondistended, no abdominal masses Back: mild right CVA tenderness Extremities: No edema Neurologic: Grossly intact  Laboratory Data:  Results for orders placed or performed during the hospital encounter of 06/07/17 (from the past 24 hour(s))  Urinalysis, Routine w reflex microscopic- may I&O cath if menses     Status: Abnormal   Collection Time: 06/07/17  4:45 AM  Result Value Ref Range   Color, Urine YELLOW YELLOW   APPearance HAZY (A) CLEAR   Specific Gravity, Urine 1.023 1.005 - 1.030   pH 5.0  5.0 - 8.0   Glucose, UA NEGATIVE NEGATIVE mg/dL   Hgb urine dipstick LARGE (A) NEGATIVE   Bilirubin Urine NEGATIVE NEGATIVE   Ketones, ur 20 (A) NEGATIVE mg/dL   Protein, ur NEGATIVE NEGATIVE mg/dL   Nitrite NEGATIVE NEGATIVE   Leukocytes, UA NEGATIVE NEGATIVE   RBC / HPF TOO NUMEROUS TO COUNT 0 - 5 RBC/hpf   WBC, UA 6-30 0 - 5 WBC/hpf   Bacteria, UA NONE SEEN NONE SEEN   Squamous Epithelial / LPF 0-5 (A) NONE SEEN   Mucus PRESENT    Hyaline Casts, UA PRESENT    Ca Oxalate Crys, UA PRESENT   Comprehensive metabolic panel     Status: Abnormal   Collection Time: 06/07/17  7:38 AM  Result  Value Ref Range   Sodium 142 135 - 145 mmol/L   Potassium 4.4 3.5 - 5.1 mmol/L   Chloride 105 101 - 111 mmol/L   CO2 27 22 - 32 mmol/L   Glucose, Bld 128 (H) 65 - 99 mg/dL   BUN 26 (H) 6 - 20 mg/dL   Creatinine, Ser 1.42 (H) 0.44 - 1.00 mg/dL   Calcium 9.8 8.9 - 10.3 mg/dL   Total Protein 7.6 6.5 - 8.1 g/dL   Albumin 4.4 3.5 - 5.0 g/dL   AST 26 15 - 41 U/L   ALT 16 14 - 54 U/L   Alkaline Phosphatase 87 38 - 126 U/L   Total Bilirubin 1.5 (H) 0.3 - 1.2 mg/dL   GFR calc non Af Amer 34 (L) >60 mL/min   GFR calc Af Amer 40 (L) >60 mL/min   Anion gap 10 5 - 15  CBC with Differential     Status: Abnormal   Collection Time: 06/07/17  7:38 AM  Result Value Ref Range   WBC 14.9 (H) 4.0 - 10.5 K/uL   RBC 4.21 3.87 - 5.11 MIL/uL   Hemoglobin 13.1 12.0 - 15.0 g/dL   HCT 39.6 36.0 - 46.0 %   MCV 94.1 78.0 - 100.0 fL   MCH 31.1 26.0 - 34.0 pg   MCHC 33.1 30.0 - 36.0 g/dL   RDW 13.0 11.5 - 15.5 %   Platelets 253 150 - 400 K/uL   Neutrophils Relative % 90 %   Neutro Abs 13.4 (H) 1.7 - 7.7 K/uL   Lymphocytes Relative 6 %   Lymphs Abs 0.9 0.7 - 4.0 K/uL   Monocytes Relative 4 %   Monocytes Absolute 0.6 0.1 - 1.0 K/uL   Eosinophils Relative 0 %   Eosinophils Absolute 0.0 0.0 - 0.7 K/uL   Basophils Relative 0 %   Basophils Absolute 0.0 0.0 - 0.1 K/uL  I-stat Chem 8, ED     Status: Abnormal   Collection Time: 06/07/17  8:10 AM  Result Value Ref Range   Sodium 140 135 - 145 mmol/L   Potassium 3.8 3.5 - 5.1 mmol/L   Chloride 101 101 - 111 mmol/L   BUN 24 (H) 6 - 20 mg/dL   Creatinine, Ser 1.20 (H) 0.44 - 1.00 mg/dL   Glucose, Bld 123 (H) 65 - 99 mg/dL   Calcium, Ion 1.17 1.15 - 1.40 mmol/L   TCO2 30 22 - 32 mmol/L   Hemoglobin 13.3 12.0 - 15.0 g/dL   HCT 39.0 36.0 - 46.0 %   No results found for this or any previous visit (from the past 240 hour(s)). Creatinine: Recent Labs    06/07/17 0738 06/07/17 0810  CREATININE 1.42* 1.20*  Impression/Assessment:  Right ureteral  calculus with persistent pain and acute renal insufficiency  Plan:  Proceed to the operating room this evening for cystoscopy, right retrograde pyelogram, right ureteroscopy with laser lithotripsy stone extraction and possible stent placement.  Marton Redwood, III 06/07/2017, 2:13 PM

## 2017-06-07 NOTE — Progress Notes (Signed)
Fortunately stone has passed. I saw it in the strainer myself.  Pain nearly resolved, may have some intermittent pain next couple of days from ureteral spasms.  May DC from urological perspective. Surgery canceled. No medications needed from my standpoint at discharge  F/u in clinic non-urgently

## 2017-06-08 DIAGNOSIS — N179 Acute kidney failure, unspecified: Secondary | ICD-10-CM

## 2017-06-08 DIAGNOSIS — N132 Hydronephrosis with renal and ureteral calculous obstruction: Secondary | ICD-10-CM | POA: Diagnosis not present

## 2017-06-08 LAB — CBC
HEMATOCRIT: 32 % — AB (ref 36.0–46.0)
Hemoglobin: 10.4 g/dL — ABNORMAL LOW (ref 12.0–15.0)
MCH: 31 pg (ref 26.0–34.0)
MCHC: 32.5 g/dL (ref 30.0–36.0)
MCV: 95.2 fL (ref 78.0–100.0)
PLATELETS: 186 10*3/uL (ref 150–400)
RBC: 3.36 MIL/uL — AB (ref 3.87–5.11)
RDW: 13 % (ref 11.5–15.5)
WBC: 4.8 10*3/uL (ref 4.0–10.5)

## 2017-06-08 LAB — BASIC METABOLIC PANEL
ANION GAP: 7 (ref 5–15)
BUN: 16 mg/dL (ref 6–20)
CHLORIDE: 110 mmol/L (ref 101–111)
CO2: 25 mmol/L (ref 22–32)
Calcium: 8.1 mg/dL — ABNORMAL LOW (ref 8.9–10.3)
Creatinine, Ser: 0.59 mg/dL (ref 0.44–1.00)
GFR calc non Af Amer: >60 mL/min (ref >60)
Glucose, Bld: 88 mg/dL (ref 65–99)
POTASSIUM: 3.1 mmol/L — AB (ref 3.5–5.1)
Sodium: 142 mmol/L (ref 135–145)

## 2017-06-08 LAB — URINE CULTURE: Culture: NO GROWTH

## 2017-06-08 NOTE — Discharge Summary (Signed)
Physician Discharge Summary  Vanessa Cannon ENI:[id number] DOB: December 17, 1935 DOA: 06/07/2017  PCP: [name], MD  Admit date: 06/07/2017 Discharge date: 06/08/2017  Admitted From: Home Disposition: Home   Recommendations for Outpatient Follow-up:  1. Follow up with PCP in 1-2 weeks, consider repeat BMP 2. Follow up with urology  Home Health: None Equipment/Devices: None Discharge Condition: Stable CODE STATUS: Full Diet recommendation: As tolerated  Brief/Interim Summary: Vanessa Cannon is a 81 y.o. female with a history of nephrolithiasis who presented to the ED with right abdominal pain, nausea, vomiting, poor per oral intake, found to have an obstructing right UVJ stone and AKI. Urology was consulted, planning cystoscopy and definitive management, though pt passed stone spontaneously (seen in strainer) with drastic improvement in pain. She is now taking per oral intake well, has no complaints. SCr improved to normal with IV fluids.   Discharge Diagnoses:  Principal Problem:   Hydronephrosis with renal and ureteral calculus obstruction Active Problems:   Nephrolithiasis   AKI (acute kidney injury) ([place])  Right UVJ stone with obstruction spontaneously passed:  - Follow up with urology, return precautions discussed  AKI: Resolved.   Discharge Instructions  Allergies as of 06/08/2017      Reactions   Ceclor [cefaclor] Rash      Medication List    TAKE these medications   albuterol 108 (90 Base) MCG/ACT inhaler Commonly known as:  PROVENTIL HFA;VENTOLIN HFA Inhale 1-2 puffs into the lungs every 6 (six) hours as needed for wheezing or shortness of breath.   cholecalciferol 1000 units tablet Commonly known as:  VITAMIN D Take 1,000 Units by mouth daily.   diazepam 5 MG tablet Commonly known as:  VALIUM Take 5 mg by mouth every 6 (six) hours as needed for anxiety.   Fish Oil 1000 MG Caps Take by mouth.   multivitamin with minerals Tabs tablet Take 1 tablet by mouth  daily.   pyridOXINE 50 MG tablet Commonly known as:  B-6 Take 50 mg by mouth daily.      Follow-up Information    [name], MD Follow up.   Specialty:  Internal Medicine Contact information: [address] [place] [address] [phone]        Raynelle Bring, MD Follow up.   Specialty:  Urology Contact information: [address] [place] [address] [phone]          Allergies  Allergen Reactions  . Ceclor [Cefaclor] Rash    Consultations:  Urology  Procedures/Studies: Dg Abdomen 1 View  Result Date: 06/07/2017 CLINICAL DATA:  Right-sided kidney stone. EXAM: ABDOMEN - 1 VIEW COMPARISON:  CT abdomen and pelvis from the same day. FINDINGS: The bowel gas pattern is normal. Moderate right-sided hydronephrosis is present with asymmetric contrast in the right renal collecting system. The stone is opacified by in the contrast in the urinary bladder. Multiple gallstones are again seen. Degenerative changes are present in the lumbar spine. IMPRESSION: 1. Moderate right-sided hydronephrosis with opacification of the collecting system. 2. The distal right ureteral stone is obscured by bladder contrast. 3. Cholelithiasis. Electronically Signed   By: San Morelle M.D.   On: 06/07/2017 11:45   Ct Abdomen Pelvis W Contrast  Result Date: 06/07/2017 CLINICAL DATA:  Right flank and groin pain with nausea. History of kidney stones. Appendicitis suspected. EXAM: CT ABDOMEN AND PELVIS WITH CONTRAST TECHNIQUE: Multidetector CT imaging of the abdomen and pelvis was performed using the standard protocol following bolus administration of intravenous contrast. CONTRAST:  165mL  ISOVUE-300 IOPAMIDOL (ISOVUE-300) INJECTION 61% COMPARISON:  Abdominopelvic CT 12/09/2012. FINDINGS: Lower chest: Scattered small subpleural nodules at both lung bases are similar to the previous study. There is a calcified left lower lobe granuloma on image 33. No significant pleural or  pericardial effusion. Hepatobiliary: There are scattered tiny hepatic cysts. No suspicious hepatic lesions. Multiple small gallstones are present. There is no gallbladder wall thickening or biliary dilatation. Pancreas: Unremarkable. No pancreatic ductal dilatation or surrounding inflammatory changes. Spleen: Normal in size without focal abnormality. Adrenals/Urinary Tract: Stable mild fullness of the left adrenal gland without focal nodule. The right adrenal gland appears normal. There is a tiny nonobstructing calculus in the lower pole of the right kidney. There is a 3 mm obstructing calculus at the right ureterovesical junction (image 63). The right kidney demonstrates hydronephrosis and mildly delayed contrast excretion. There is mild asymmetric perinephric soft tissue stranding on the right. Small low-density renal lesions bilaterally are stable and consistent with cysts. The bladder appears unremarkable. Stomach/Bowel: No evidence of bowel wall thickening, distention or surrounding inflammatory change. The appendix appears normal. Mild sigmoid diverticulosis. Vascular/Lymphatic: There are no enlarged abdominal or pelvic lymph nodes. Minimal aortic atherosclerosis. Retroaortic left renal vein. Reproductive: Hysterectomy.  No adnexal mass. Other: Small umbilical hernia containing only fat.  No ascites. Musculoskeletal: No acute or significant osseous findings. There are degenerative changes in the spine with a stable convex left scoliosis and degenerative anterolisthesis at L4-5. IMPRESSION: 1. No evidence of appendicitis. 2. Obstructing 3 mm calculus at the right ureterovesical junction. 3. Small right renal calculus, cholelithiasis and stable renal cystic lesions bilaterally. Small umbilical hernia containing only fat. Electronically Signed   By: Richardean Sale M.D.   On: 06/07/2017 09:39    Subjective: Pain is controlled, no more nausea or vomiting. Ate all of breakfast, urine is clear yellow.    Discharge Exam: Vitals:   06/07/17 2121 06/08/17 0450  BP: (!) 111/51 (!) 114/39  Pulse: 79 83  Resp: 18 18  Temp: 97.9 F (36.6 C) 98.9 F (37.2 C)  SpO2: 94% 94%   General: Pt is alert, awake, not in acute distress Cardiovascular: RRR, S1/S2 +, no rubs, no gallops Respiratory: CTA bilaterally, no wheezing, no rhonchi Abdominal: Soft, NT, ND, bowel sounds + Extremities: No edema, no cyanosis  Labs: BNP (last 3 results) No results for input(s): BNP in the last 8760 hours. Basic Metabolic Panel: Recent Labs  Lab 06/07/17 0738 06/07/17 0810 06/08/17 0536  NA 142 140 142  K 4.4 3.8 3.1*  CL 105 101 110  CO2 27  --  25  GLUCOSE 128* 123* 88  BUN 26* 24* 16  CREATININE 1.42* 1.20* 0.59  CALCIUM 9.8  --  8.1*   Liver Function Tests: Recent Labs  Lab 06/07/17 0738  AST 26  ALT 16  ALKPHOS 87  BILITOT 1.5*  PROT 7.6  ALBUMIN 4.4   No results for input(s): LIPASE, AMYLASE in the last 168 hours. No results for input(s): AMMONIA in the last 168 hours. CBC: Recent Labs  Lab 06/07/17 0738 06/07/17 0810  WBC 14.9*  --   NEUTROABS 13.4*  --   HGB 13.1 13.3  HCT 39.6 39.0  MCV 94.1  --   PLT 253  --    Cardiac Enzymes: No results for input(s): CKTOTAL, CKMB, CKMBINDEX, TROPONINI in the last 168 hours. BNP: Invalid input(s): POCBNP CBG: No results for input(s): GLUCAP in the last 168 hours. D-Dimer No results for input(s): DDIMER in the last  72 hours. Hgb A1c No results for input(s): HGBA1C in the last 72 hours. Lipid Profile No results for input(s): CHOL, HDL, LDLCALC, TRIG, CHOLHDL, LDLDIRECT in the last 72 hours. Thyroid function studies No results for input(s): TSH, T4TOTAL, T3FREE, THYROIDAB in the last 72 hours.  Invalid input(s): FREET3 Anemia work up No results for input(s): VITAMINB12, FOLATE, FERRITIN, TIBC, IRON, RETICCTPCT in the last 72 hours. Urinalysis    Component Value Date/Time   COLORURINE YELLOW 06/07/2017 0445   APPEARANCEUR  HAZY (A) 06/07/2017 0445   LABSPEC 1.023 06/07/2017 0445   PHURINE 5.0 06/07/2017 0445   GLUCOSEU NEGATIVE 06/07/2017 0445   HGBUR LARGE (A) 06/07/2017 0445   BILIRUBINUR NEGATIVE 06/07/2017 0445   KETONESUR 20 (A) 06/07/2017 0445   PROTEINUR NEGATIVE 06/07/2017 0445   UROBILINOGEN 0.2 12/05/2009 0036   NITRITE NEGATIVE 06/07/2017 0445   LEUKOCYTESUR NEGATIVE 06/07/2017 0445    Microbiology Recent Results (from the past 240 hour(s))  Urine culture     Status: None   Collection Time: 06/07/17 11:00 AM  Result Value Ref Range Status   Specimen Description URINE, CLEAN CATCH  Final   Special Requests NONE  Final   Culture   Final    NO GROWTH Performed at [facility], [place] [address], [place], [place] [address]    Report Status 06/08/2017 FINAL  Final    Time coordinating discharge: Approximately 40 minutes  Vance Gather, MD  [facility] 06/08/2017, 10:42 AM Pager [phone]

## 2017-07-19 DIAGNOSIS — Z0000 Encounter for general adult medical examination without abnormal findings: Secondary | ICD-10-CM | POA: Diagnosis not present

## 2017-07-19 DIAGNOSIS — K219 Gastro-esophageal reflux disease without esophagitis: Secondary | ICD-10-CM | POA: Diagnosis not present

## 2018-04-01 DIAGNOSIS — H353131 Nonexudative age-related macular degeneration, bilateral, early dry stage: Secondary | ICD-10-CM | POA: Diagnosis not present

## 2018-04-01 DIAGNOSIS — H2513 Age-related nuclear cataract, bilateral: Secondary | ICD-10-CM | POA: Diagnosis not present

## 2018-04-01 DIAGNOSIS — D3131 Benign neoplasm of right choroid: Secondary | ICD-10-CM | POA: Diagnosis not present

## 2018-07-23 DIAGNOSIS — M810 Age-related osteoporosis without current pathological fracture: Secondary | ICD-10-CM | POA: Diagnosis not present

## 2018-07-23 DIAGNOSIS — Z0000 Encounter for general adult medical examination without abnormal findings: Secondary | ICD-10-CM | POA: Diagnosis not present

## 2018-07-23 DIAGNOSIS — Z01419 Encounter for gynecological examination (general) (routine) without abnormal findings: Secondary | ICD-10-CM | POA: Diagnosis not present

## 2018-07-23 DIAGNOSIS — Z1212 Encounter for screening for malignant neoplasm of rectum: Secondary | ICD-10-CM | POA: Diagnosis not present

## 2018-07-23 DIAGNOSIS — E559 Vitamin D deficiency, unspecified: Secondary | ICD-10-CM | POA: Diagnosis not present

## 2019-04-02 DIAGNOSIS — H353131 Nonexudative age-related macular degeneration, bilateral, early dry stage: Secondary | ICD-10-CM | POA: Diagnosis not present

## 2019-04-02 DIAGNOSIS — H2513 Age-related nuclear cataract, bilateral: Secondary | ICD-10-CM | POA: Diagnosis not present

## 2019-04-02 DIAGNOSIS — D3131 Benign neoplasm of right choroid: Secondary | ICD-10-CM | POA: Diagnosis not present

## 2019-06-16 ENCOUNTER — Other Ambulatory Visit: Payer: Self-pay | Admitting: Internal Medicine

## 2019-06-16 DIAGNOSIS — R634 Abnormal weight loss: Secondary | ICD-10-CM | POA: Diagnosis not present

## 2019-06-16 DIAGNOSIS — N644 Mastodynia: Secondary | ICD-10-CM

## 2019-06-16 DIAGNOSIS — R0781 Pleurodynia: Secondary | ICD-10-CM | POA: Diagnosis not present

## 2019-06-16 DIAGNOSIS — R42 Dizziness and giddiness: Secondary | ICD-10-CM | POA: Diagnosis not present

## 2019-06-22 ENCOUNTER — Other Ambulatory Visit: Payer: Self-pay | Admitting: Internal Medicine

## 2019-06-29 ENCOUNTER — Ambulatory Visit
Admission: RE | Admit: 2019-06-29 | Discharge: 2019-06-29 | Disposition: A | Discharge status: Home / Self Care | Payer: BC Managed Care – PPO | Source: Ambulatory Visit | Attending: Internal Medicine | Admitting: Internal Medicine

## 2019-06-29 ENCOUNTER — Other Ambulatory Visit: Payer: Self-pay

## 2019-06-29 ENCOUNTER — Other Ambulatory Visit: Payer: Self-pay | Admitting: Internal Medicine

## 2019-06-29 DIAGNOSIS — N644 Mastodynia: Secondary | ICD-10-CM

## 2019-06-29 DIAGNOSIS — N6489 Other specified disorders of breast: Secondary | ICD-10-CM

## 2019-07-02 ENCOUNTER — Ambulatory Visit
Admission: RE | Admit: 2019-07-02 | Discharge: 2019-07-02 | Disposition: A | Discharge status: Home / Self Care | Payer: BC Managed Care – PPO | Source: Ambulatory Visit | Attending: Internal Medicine | Admitting: Internal Medicine

## 2019-07-02 ENCOUNTER — Other Ambulatory Visit: Payer: Self-pay

## 2019-07-02 DIAGNOSIS — R928 Other abnormal and inconclusive findings on diagnostic imaging of breast: Secondary | ICD-10-CM | POA: Diagnosis not present

## 2019-07-02 DIAGNOSIS — N6489 Other specified disorders of breast: Secondary | ICD-10-CM

## 2019-07-21 ENCOUNTER — Other Ambulatory Visit: Payer: Self-pay | Admitting: Surgery

## 2019-07-21 DIAGNOSIS — N6320 Unspecified lump in the left breast, unspecified quadrant: Secondary | ICD-10-CM | POA: Diagnosis not present

## 2019-07-21 DIAGNOSIS — N6022 Fibroadenosis of left breast: Secondary | ICD-10-CM

## 2019-07-29 ENCOUNTER — Other Ambulatory Visit: Payer: Self-pay | Admitting: Surgery

## 2019-07-29 DIAGNOSIS — N6022 Fibroadenosis of left breast: Secondary | ICD-10-CM

## 2019-08-08 ENCOUNTER — Other Ambulatory Visit (HOSPITAL_COMMUNITY): Payer: BC Managed Care – PPO

## 2019-09-03 ENCOUNTER — Encounter (HOSPITAL_BASED_OUTPATIENT_CLINIC_OR_DEPARTMENT_OTHER): Payer: Self-pay | Admitting: Surgery

## 2019-09-03 ENCOUNTER — Other Ambulatory Visit: Payer: Self-pay

## 2019-09-05 ENCOUNTER — Other Ambulatory Visit (HOSPITAL_COMMUNITY)
Admission: RE | Admit: 2019-09-05 | Discharge: 2019-09-05 | Disposition: A | Discharge status: Home / Self Care | Payer: BC Managed Care – PPO | Source: Ambulatory Visit | Attending: Surgery | Admitting: Surgery

## 2019-09-05 DIAGNOSIS — Z01812 Encounter for preprocedural laboratory examination: Secondary | ICD-10-CM | POA: Insufficient documentation

## 2019-09-05 DIAGNOSIS — Z20822 Contact with and (suspected) exposure to covid-19: Secondary | ICD-10-CM | POA: Insufficient documentation

## 2019-09-05 LAB — SARS CORONAVIRUS 2 (TAT 6-24 HRS): SARS Coronavirus 2: NEGATIVE

## 2019-09-08 ENCOUNTER — Other Ambulatory Visit: Payer: Self-pay

## 2019-09-08 ENCOUNTER — Ambulatory Visit
Admission: RE | Admit: 2019-09-08 | Discharge: 2019-09-08 | Disposition: A | Discharge status: Home / Self Care | Payer: BC Managed Care – PPO | Source: Ambulatory Visit | Attending: Surgery | Admitting: Surgery

## 2019-09-08 DIAGNOSIS — N6022 Fibroadenosis of left breast: Secondary | ICD-10-CM

## 2019-09-08 DIAGNOSIS — D242 Benign neoplasm of left breast: Secondary | ICD-10-CM | POA: Diagnosis not present

## 2019-09-08 MED ORDER — ENSURE PRE-SURGERY PO LIQD: 296.0000 mL | Freq: Once | ORAL | Status: DC
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
## 2019-09-08 NOTE — Progress Notes (Signed)

## 2019-09-08 NOTE — Anesthesia Preprocedure Evaluation (Addendum)
Anesthesia Evaluation  Patient identified by MRN, date of birth, ID band Patient awake    Reviewed: Allergy & Precautions, NPO status , Patient's Chart, lab work & pertinent test results  Airway Mallampati: II  TM Distance: >3 FB Neck ROM: Full    Dental no notable dental hx. (+) Teeth Intact   Pulmonary neg pulmonary ROS,    Pulmonary exam normal breath sounds clear to auscultation       Cardiovascular Normal cardiovascular exam+ Valvular Problems/Murmurs  Rhythm:Regular Rate:Normal     Neuro/Psych PSYCHIATRIC DISORDERS Anxiety negative neurological ROS     GI/Hepatic   Endo/Other  Left breast mass  Renal/GU Renal diseaseHx/o renal calculi  negative genitourinary   Musculoskeletal negative musculoskeletal ROS (+)   Abdominal   Peds  Hematology negative hematology ROS (+)   Anesthesia Other Findings   Reproductive/Obstetrics                           Anesthesia Physical Anesthesia Plan  ASA: II  Anesthesia Plan: General   Post-op Pain Management:    Induction: Intravenous  PONV Risk Score and Plan: 4 or greater and Ondansetron, Treatment may vary due to age or medical condition and Dexamethasone  Airway Management Planned: LMA  Additional Equipment:   Intra-op Plan:   Post-operative Plan: Extubation in OR  Informed Consent: I have reviewed the patients History and Physical, chart, labs and discussed the procedure including the risks, benefits and alternatives for the proposed anesthesia with the patient or authorized representative who has indicated his/her understanding and acceptance.     Dental advisory given  Plan Discussed with: CRNA and Surgeon  Anesthesia Plan Comments:        Anesthesia Quick Evaluation

## 2019-09-08 NOTE — H&P (View-Only) (Signed)

## 2019-09-09 ENCOUNTER — Encounter (HOSPITAL_BASED_OUTPATIENT_CLINIC_OR_DEPARTMENT_OTHER)
Admission: RE | Disposition: A | Discharge status: Home / Self Care | Payer: Self-pay | Source: Home / Self Care | Attending: Surgery

## 2019-09-09 ENCOUNTER — Other Ambulatory Visit: Payer: Self-pay

## 2019-09-09 ENCOUNTER — Ambulatory Visit (HOSPITAL_BASED_OUTPATIENT_CLINIC_OR_DEPARTMENT_OTHER)
Admission: RE | Admit: 2019-09-09 | Discharge: 2019-09-09 | Disposition: A | Discharge status: Home / Self Care | Payer: BC Managed Care – PPO | Attending: Surgery | Admitting: Surgery

## 2019-09-09 ENCOUNTER — Ambulatory Visit (HOSPITAL_BASED_OUTPATIENT_CLINIC_OR_DEPARTMENT_OTHER): Payer: BC Managed Care – PPO | Admitting: Anesthesiology

## 2019-09-09 ENCOUNTER — Encounter (HOSPITAL_BASED_OUTPATIENT_CLINIC_OR_DEPARTMENT_OTHER): Payer: Self-pay | Admitting: Surgery

## 2019-09-09 ENCOUNTER — Encounter (HOSPITAL_COMMUNITY): Payer: Self-pay

## 2019-09-09 ENCOUNTER — Ambulatory Visit
Admission: RE | Admit: 2019-09-09 | Discharge: 2019-09-09 | Disposition: A | Discharge status: Home / Self Care | Payer: BC Managed Care – PPO | Source: Ambulatory Visit | Attending: Surgery | Admitting: Surgery

## 2019-09-09 DIAGNOSIS — D242 Benign neoplasm of left breast: Secondary | ICD-10-CM | POA: Insufficient documentation

## 2019-09-09 DIAGNOSIS — N6022 Fibroadenosis of left breast: Secondary | ICD-10-CM | POA: Diagnosis not present

## 2019-09-09 DIAGNOSIS — N6489 Other specified disorders of breast: Secondary | ICD-10-CM | POA: Diagnosis not present

## 2019-09-09 DIAGNOSIS — Z90710 Acquired absence of both cervix and uterus: Secondary | ICD-10-CM | POA: Diagnosis not present

## 2019-09-09 DIAGNOSIS — N132 Hydronephrosis with renal and ureteral calculous obstruction: Secondary | ICD-10-CM | POA: Diagnosis not present

## 2019-09-09 DIAGNOSIS — F419 Anxiety disorder, unspecified: Secondary | ICD-10-CM | POA: Diagnosis not present

## 2019-09-09 DIAGNOSIS — N179 Acute kidney failure, unspecified: Secondary | ICD-10-CM | POA: Diagnosis not present

## 2019-09-09 DIAGNOSIS — Z87442 Personal history of urinary calculi: Secondary | ICD-10-CM | POA: Diagnosis not present

## 2019-09-09 HISTORY — DX: Personal history of urinary calculi: Z87.442

## 2019-09-09 HISTORY — PX: BREAST LUMPECTOMY WITH RADIOACTIVE SEED LOCALIZATION: SHX6424

## 2019-09-09 SURGERY — BREAST LUMPECTOMY WITH RADIOACTIVE SEED LOCALIZATION
Anesthesia: General | Site: Breast | Laterality: Left

## 2019-09-09 MED ORDER — MIDAZOLAM HCL 2 MG/2ML IJ SOLN: 1.0000 mg | INTRAMUSCULAR | Status: DC | PRN

## 2019-09-09 MED ORDER — LIDOCAINE 2% (20 MG/ML) 5 ML SYRINGE
INTRAMUSCULAR | Status: DC | PRN
  Administered 2019-09-09: 40 mg via INTRAVENOUS

## 2019-09-09 MED ORDER — GABAPENTIN 300 MG PO CAPS
ORAL_CAPSULE | ORAL | Status: AC
  Filled 2019-09-09: qty 1

## 2019-09-09 MED ORDER — CHLORHEXIDINE GLUCONATE CLOTH 2 % EX PADS: 6.0000 | MEDICATED_PAD | Freq: Once | CUTANEOUS | Status: DC

## 2019-09-09 MED ORDER — FENTANYL CITRATE (PF) 100 MCG/2ML IJ SOLN
INTRAMUSCULAR | Status: DC | PRN
  Administered 2019-09-09: 50 ug via INTRAVENOUS

## 2019-09-09 MED ORDER — ONDANSETRON HCL 4 MG/2ML IJ SOLN
INTRAMUSCULAR | Status: AC
  Filled 2019-09-09: qty 2

## 2019-09-09 MED ORDER — DEXAMETHASONE SODIUM PHOSPHATE 4 MG/ML IJ SOLN
INTRAMUSCULAR | Status: DC | PRN
  Administered 2019-09-09: 4 mg via INTRAVENOUS

## 2019-09-09 MED ORDER — FENTANYL CITRATE (PF) 100 MCG/2ML IJ SOLN: 50.0000 ug | INTRAMUSCULAR | Status: DC | PRN

## 2019-09-09 MED ORDER — TRAMADOL HCL 50 MG PO TABS: 50.0000 mg | ORAL_TABLET | Freq: Four times a day (QID) | ORAL | 0 refills | Status: AC | PRN

## 2019-09-09 MED ORDER — FENTANYL CITRATE (PF) 100 MCG/2ML IJ SOLN: 25.0000 ug | INTRAMUSCULAR | Status: DC | PRN

## 2019-09-09 MED ORDER — ACETAMINOPHEN 500 MG PO TABS
ORAL_TABLET | ORAL | Status: AC
  Filled 2019-09-09: qty 2

## 2019-09-09 MED ORDER — PROPOFOL 500 MG/50ML IV EMUL
INTRAVENOUS | Status: AC
  Filled 2019-09-09: qty 50

## 2019-09-09 MED ORDER — BUPIVACAINE HCL (PF) 0.5 % IJ SOLN
INTRAMUSCULAR | Status: AC
  Filled 2019-09-09: qty 150

## 2019-09-09 MED ORDER — EPHEDRINE SULFATE 50 MG/ML IJ SOLN
INTRAMUSCULAR | Status: DC | PRN
  Administered 2019-09-09 (×2): 10 mg via INTRAVENOUS

## 2019-09-09 MED ORDER — OXYCODONE HCL 5 MG/5ML PO SOLN: 5.0000 mg | Freq: Once | ORAL | Status: DC | PRN

## 2019-09-09 MED ORDER — BUPIVACAINE HCL (PF) 0.5 % IJ SOLN
INTRAMUSCULAR | Status: DC | PRN
  Administered 2019-09-09: 10 mL

## 2019-09-09 MED ORDER — GABAPENTIN 300 MG PO CAPS: 300.0000 mg | ORAL_CAPSULE | ORAL | Status: DC

## 2019-09-09 MED ORDER — ONDANSETRON HCL 4 MG/2ML IJ SOLN: 4.0000 mg | Freq: Once | INTRAMUSCULAR | Status: DC | PRN

## 2019-09-09 MED ORDER — LACTATED RINGERS IV SOLN: INTRAVENOUS | Status: DC

## 2019-09-09 MED ORDER — PROPOFOL 10 MG/ML IV BOLUS
INTRAVENOUS | Status: DC | PRN
  Administered 2019-09-09: 50 mg via INTRAVENOUS
  Administered 2019-09-09: 100 mg via INTRAVENOUS

## 2019-09-09 MED ORDER — OXYCODONE HCL 5 MG PO TABS: 5.0000 mg | ORAL_TABLET | Freq: Once | ORAL | Status: DC | PRN

## 2019-09-09 MED ORDER — LIDOCAINE 2% (20 MG/ML) 5 ML SYRINGE
INTRAMUSCULAR | Status: AC
  Filled 2019-09-09: qty 5

## 2019-09-09 MED ORDER — CIPROFLOXACIN IN D5W 400 MG/200ML IV SOLN
400.0000 mg | INTRAVENOUS | Status: AC
  Administered 2019-09-09: 08:00:00 400 mg via INTRAVENOUS

## 2019-09-09 MED ORDER — CIPROFLOXACIN IN D5W 400 MG/200ML IV SOLN
INTRAVENOUS | Status: AC
  Filled 2019-09-09: qty 200

## 2019-09-09 MED ORDER — FENTANYL CITRATE (PF) 100 MCG/2ML IJ SOLN
INTRAMUSCULAR | Status: AC
  Filled 2019-09-09: qty 2

## 2019-09-09 MED ORDER — DEXAMETHASONE SODIUM PHOSPHATE 10 MG/ML IJ SOLN
INTRAMUSCULAR | Status: AC
  Filled 2019-09-09: qty 1

## 2019-09-09 MED ORDER — ACETAMINOPHEN 500 MG PO TABS
1000.0000 mg | ORAL_TABLET | ORAL | Status: AC
  Administered 2019-09-09: 1000 mg via ORAL

## 2019-09-09 SURGICAL SUPPLY — 43 items
ADH SKN CLS APL DERMABOND .7 (GAUZE/BANDAGES/DRESSINGS) ×1
APL PRP STRL LF DISP 70% ISPRP (MISCELLANEOUS) ×1
BINDER BREAST MEDIUM (GAUZE/BANDAGES/DRESSINGS) IMPLANT
BLADE SURG 15 STRL LF DISP TIS (BLADE) ×1 IMPLANT
BLADE SURG 15 STRL SS (BLADE) ×3
CANISTER SUC SOCK COL 7IN (MISCELLANEOUS) IMPLANT
CANISTER SUCT 1200ML W/VALVE (MISCELLANEOUS) IMPLANT
CHLORAPREP W/TINT 26 (MISCELLANEOUS) ×3 IMPLANT
COVER BACK TABLE 60X90IN (DRAPES) ×3 IMPLANT
COVER MAYO STAND STRL (DRAPES) ×3 IMPLANT
COVER PROBE W GEL 5X96 (DRAPES) ×3 IMPLANT
DECANTER SPIKE VIAL GLASS SM (MISCELLANEOUS) IMPLANT
DERMABOND ADVANCED (GAUZE/BANDAGES/DRESSINGS) ×2
DERMABOND ADVANCED .7 DNX12 (GAUZE/BANDAGES/DRESSINGS) ×1 IMPLANT
DRAPE LAPAROSCOPIC ABDOMINAL (DRAPES) ×3 IMPLANT
DRAPE UTILITY XL STRL (DRAPES) ×3 IMPLANT
ELECT REM PT RETURN 9FT ADLT (ELECTROSURGICAL) ×3
ELECTRODE REM PT RTRN 9FT ADLT (ELECTROSURGICAL) ×1 IMPLANT
GLOVE BIOGEL PI IND STRL 7.0 (GLOVE) IMPLANT
GLOVE BIOGEL PI INDICATOR 7.0 (GLOVE) ×2
GLOVE SURG SIGNA 7.5 PF LTX (GLOVE) ×3 IMPLANT
GLOVE SURG SS PI 7.0 STRL IVOR (GLOVE) ×2 IMPLANT
GOWN STRL REUS W/ TWL LRG LVL3 (GOWN DISPOSABLE) ×1 IMPLANT
GOWN STRL REUS W/ TWL XL LVL3 (GOWN DISPOSABLE) ×1 IMPLANT
GOWN STRL REUS W/TWL LRG LVL3 (GOWN DISPOSABLE) ×3
GOWN STRL REUS W/TWL XL LVL3 (GOWN DISPOSABLE) ×3
KIT MARKER MARGIN INK (KITS) ×3 IMPLANT
NDL HYPO 25X1 1.5 SAFETY (NEEDLE) ×1 IMPLANT
NEEDLE HYPO 25X1 1.5 SAFETY (NEEDLE) ×3 IMPLANT
NS IRRIG 1000ML POUR BTL (IV SOLUTION) ×2 IMPLANT
PACK BASIN DAY SURGERY FS (CUSTOM PROCEDURE TRAY) ×3 IMPLANT
PENCIL SMOKE EVACUATOR (MISCELLANEOUS) ×3 IMPLANT
SLEEVE SCD COMPRESS KNEE MED (MISCELLANEOUS) ×3 IMPLANT
SPONGE LAP 4X18 RFD (DISPOSABLE) ×3 IMPLANT
SUT MNCRL AB 4-0 PS2 18 (SUTURE) ×3 IMPLANT
SUT VIC AB 3-0 SH 27 (SUTURE) ×3
SUT VIC AB 3-0 SH 27X BRD (SUTURE) ×1 IMPLANT
SYR CONTROL 10ML LL (SYRINGE) ×3 IMPLANT
TOWEL GREEN STERILE FF (TOWEL DISPOSABLE) ×3 IMPLANT
TRAY FAXITRON CT DISP (TRAY / TRAY PROCEDURE) ×3 IMPLANT
TUBE CONNECTING 20'X1/4 (TUBING) ×1
TUBE CONNECTING 20X1/4 (TUBING) ×1 IMPLANT
YANKAUER SUCT BULB TIP NO VENT (SUCTIONS) ×2 IMPLANT

## 2019-09-09 NOTE — Anesthesia Postprocedure Evaluation (Signed)
Anesthesia Post Note  Patient: Vanessa Cannon  Procedure(s) Performed: LEFT BREAST LUMPECTOMY WITH RADIOACTIVE SEED LOCALIZATION (Left Breast)     Patient location during evaluation: PACU Anesthesia Type: General Level of consciousness: awake and alert and oriented Pain management: pain level controlled Vital Signs Assessment: post-procedure vital signs reviewed and stable Respiratory status: spontaneous breathing, nonlabored ventilation and respiratory function stable Cardiovascular status: blood pressure returned to baseline and stable Postop Assessment: no apparent nausea or vomiting Anesthetic complications: no    Last Vitals:  Vitals:   09/09/19 0907 09/09/19 0915  BP:  (!) 142/60  Pulse:  85  Resp:  13  Temp:    SpO2: 100% 100%    Last Pain:  Vitals:   09/09/19 0900  TempSrc:   PainSc: 0-No pain                 Nashon Erbes A.

## 2019-09-09 NOTE — Interval H&P Note (Signed)
History and Physical Interval Note:no change in H and P  09/09/2019 7:52 AM  Vanessa Cannon  has presented today for surgery, with the diagnosis of left breast complex sclerosing lesion/papilloma.  The various methods of treatment have been discussed with the patient and family. After consideration of risks, benefits and other options for treatment, the patient has consented to  Procedure(s): LEFT BREAST LUMPECTOMY WITH RADIOACTIVE SEED LOCALIZATION (Left) as a surgical intervention.  The patient's history has been reviewed, patient examined, no change in status, stable for surgery.  I have reviewed the patient's chart and labs.  Questions were answered to the patient's satisfaction.     Coralie Keens

## 2019-09-09 NOTE — Discharge Instructions (Signed)
Next dose of Tylenol at 1:45 PM   [employer] Phone Number [phone]  BREAST BIOPSY/ PARTIAL MASTECTOMY: POST OP INSTRUCTIONS  Always review your discharge instruction sheet given to you by the facility where your surgery was performed.  IF YOU HAVE DISABILITY OR FAMILY LEAVE FORMS, YOU MUST BRING THEM TO THE OFFICE FOR PROCESSING.  DO NOT GIVE THEM TO YOUR DOCTOR.  1. A prescription for pain medication may be given to you upon discharge.  Take your pain medication as prescribed, if needed.  If narcotic pain medicine is not needed, then you may take acetaminophen (Tylenol) or ibuprofen (Advil) as needed. 2. Take your usually prescribed medications unless otherwise directed 3. If you need a refill on your pain medication, please contact your pharmacy.  They will contact our office to request authorization.  Prescriptions will not be filled after 5pm or on week-ends. 4. You should eat very light the first 24 hours after surgery, such as soup, crackers, pudding, etc.  Resume your normal diet the day after surgery. 5. Most patients will experience some swelling and bruising in the breast.  Ice packs and a good support bra will help.  Swelling and bruising can take several days to resolve.  6. It is common to experience some constipation if taking pain medication after surgery.  Increasing fluid intake and taking a stool softener will usually help or prevent this problem from occurring.  A mild laxative (Milk of Magnesia or Miralax) should be taken according to package directions if there are no bowel movements after 48 hours. 7. Unless discharge instructions indicate otherwise, you may remove your bandages 24-48 hours after surgery, and you may shower at that time.  You may have steri-strips (small skin tapes) in place directly over the incision.  These strips should be left on the skin for 7-10 days.  If your surgeon used skin glue on the incision, you may shower in 24 hours.   The glue will flake off over the next 2-3 weeks.  Any sutures or staples will be removed at the office during your follow-up visit. 8. ACTIVITIES:  You may resume regular daily activities (gradually increasing) beginning the next day.  Wearing a good support bra or sports bra minimizes pain and swelling.  You may have sexual intercourse when it is comfortable. a. You may drive when you no longer are taking prescription pain medication, you can comfortably wear a seatbelt, and you can safely maneuver your car and apply brakes. b. RETURN TO WORK:  ______________________________________________________________________________________ 9. You should see your doctor in the office for a follow-up appointment approximately two weeks after your surgery.  Your doctor's nurse will typically make your follow-up appointment when she calls you with your pathology report.  Expect your pathology report 2-3 business days after your surgery.  You may call to check if you do not hear from [place] after three days. 10. OTHER INSTRUCTIONS:OK TO SHOWER STARTING TOMORROW 11. ICE PACK, TYLENOL, IBUPROFEN FOR PAIN 12. NO VIGOROUS ACTIVITY FOR ONE WEEK _______________________________________________________________________________________________ _____________________________________________________________________________________________________________________________________ _____________________________________________________________________________________________________________________________________ _____________________________________________________________________________________________________________________________________  WHEN TO CALL YOUR DOCTOR: 1. Fever over 101.0 2. Nausea and/or vomiting. 3. Extreme swelling or bruising. 4. Continued bleeding from incision. 5. Increased pain, redness, or drainage from the incision.  The clinic staff is available to answer your questions during regular business hours.   Please don't hesitate to call and ask to speak to one of the nurses for clinical concerns.  If you have a medical emergency, go to the nearest  emergency room or call 911.  A surgeon from [facility] is always on call at the hospital.  For further questions, please visit [email or web address]     Post Anesthesia Home Care Instructions  Activity: Get plenty of rest for the remainder of the day. A responsible individual must stay with you for 24 hours following the procedure.  For the next 24 hours, DO NOT: -Drive a car -Paediatric nurse -Drink alcoholic beverages -Take any medication unless instructed by your physician -Make any legal decisions or sign important papers.  Meals: Start with liquid foods such as gelatin or soup. Progress to regular foods as tolerated. Avoid greasy, spicy, heavy foods. If nausea and/or vomiting occur, drink only clear liquids until the nausea and/or vomiting subsides. Call your physician if vomiting continues.  Special Instructions/Symptoms: Your throat may feel dry or sore from the anesthesia or the breathing tube placed in your throat during surgery. If this causes discomfort, gargle with warm salt water. The discomfort should disappear within 24 hours.  If you had a scopolamine patch placed behind your ear for the management of post- operative nausea and/or vomiting:  1. The medication in the patch is effective for 72 hours, after which it should be removed.  Wrap patch in a tissue and discard in the trash. Wash hands thoroughly with soap and water. 2. You may remove the patch earlier than 72 hours if you experience unpleasant side effects which may include dry mouth, dizziness or visual disturbances. 3. Avoid touching the patch. Wash your hands with soap and water after contact with the patch.

## 2019-09-09 NOTE — Op Note (Signed)
LEFT BREAST LUMPECTOMY WITH RADIOACTIVE SEED LOCALIZATION  Procedure Note  Vanessa Cannon 09/09/2019   Pre-op Diagnosis: left breast complex sclerosing lesion/papilloma     Post-op Diagnosis: same  Procedure(s): LEFT BREAST LUMPECTOMY WITH RADIOACTIVE SEED LOCALIZATION  Surgeon(s): Coralie Keens, MD  Anesthesia: General  Staff:  Circulator: Izora Ribas, RN Scrub Person: Murvin Natal  Estimated Blood Loss: Minimal               Specimens: sent to path  Indications: This is an 84 year old female who was found on screening mammography to have an abnormal area in the left breast at the 3 o'clock position.  She underwent a stereotactic biopsy showing both a complex grossing lesion and pieces of a papilloma.  A radioactive seed guided left breast lumpectomy was recommended after discussion.  Procedure: The patient was brought to the operating room and identifies correct patient.  She is placed upon the operating room table and general anesthesia was induced.  Her left breast and chest were prepped and draped in the usual sterile fashion.  Using the neoprobe, the seed was identified at the 3 o'clock position of the left breast near the nipple areolar complex.  I anesthetized the skin at the edge of the complex with Marcaine.  I then made a circumareolar incision with a scalpel.  I dissected down into the breast tissue with the cautery.  With the aid of the neoprobe I then performed a lumpectomy staying around the seed with the aid of the neoprobe.  Once the lumpectomy specimen was completely removed, I confirmed that the seed appeared to be in the specimen with the neoprobe.  All margins of the specimen were marked with pain.  An x-ray was performed confirming that the radioactive seed and previously placed biopsy clip were in the specimen.  The lumpectomy specimen was then sent to pathology for evaluation.  I then achieved hemostasis with cautery and the lumpectomy site.  I  injected the incision further with Marcaine.  I then closed the subcutaneous tissue with interrupted 3-0 Vicryl sutures and closed the skin with a running 4-0 Monocryl.  Dermabond was then applied.  The patient tolerated the procedure well.  All the counts were correct at the end of the procedure.  The patient was then extubated in the operating room and taken in a stable condition to the recovery room.          Coralie Keens   Date: 09/09/2019  Time: 8:49 AM

## 2019-09-09 NOTE — Anesthesia Procedure Notes (Signed)
Procedure Name: LMA Insertion Date/Time: 09/09/2019 8:25 AM Performed by: Maryella Shivers, CRNA Pre-anesthesia Checklist: Patient identified, Emergency Drugs available, Suction available and Patient being monitored Patient Re-evaluated:Patient Re-evaluated prior to induction Oxygen Delivery Method: Circle system utilized Preoxygenation: Pre-oxygenation with 100% oxygen Induction Type: IV induction Ventilation: Mask ventilation without difficulty LMA: LMA inserted LMA Size: 4.0 Number of attempts: 1 Airway Equipment and Method: Bite block Placement Confirmation: positive ETCO2 Tube secured with: Tape Dental Injury: Teeth and Oropharynx as per pre-operative assessment

## 2019-09-09 NOTE — Transfer of Care (Signed)
Immediate Anesthesia Transfer of Care Note  Patient: [name]  Procedure(s) Performed: LEFT BREAST LUMPECTOMY WITH RADIOACTIVE SEED LOCALIZATION (Left Breast)  Patient Location: PACU  Anesthesia Type:General  Level of Consciousness: sedated  Airway & Oxygen Therapy: Patient Spontanous Breathing and Patient connected to face mask oxygen  Post-op Assessment: Report given to RN and Post -op Vital signs reviewed and stable  Post vital signs: Reviewed and stable  Last Vitals:  Vitals Value Taken Time  BP 122/52 09/09/19 0852  Temp    Pulse 87 09/09/19 0853  Resp 13 09/09/19 0853  SpO2 100 % 09/09/19 0853  Vitals shown include unvalidated device data.  Last Pain:  Vitals:   09/09/19 0729  TempSrc: Oral  PainSc: 0-No pain         Complications: No apparent anesthesia complications

## 2019-09-10 ENCOUNTER — Encounter: Payer: Self-pay | Admitting: *Deleted

## 2019-09-10 LAB — SURGICAL PATHOLOGY

## 2019-10-03 IMAGING — DX DG ABDOMEN 1V
1 series · 1 of 1 positions shown · non-contrast
Comparison: CT abdomen and pelvis from the same day.

CLINICAL DATA: Right-sided kidney stone.

EXAM:
ABDOMEN - 1 VIEW

[abdomen kub]
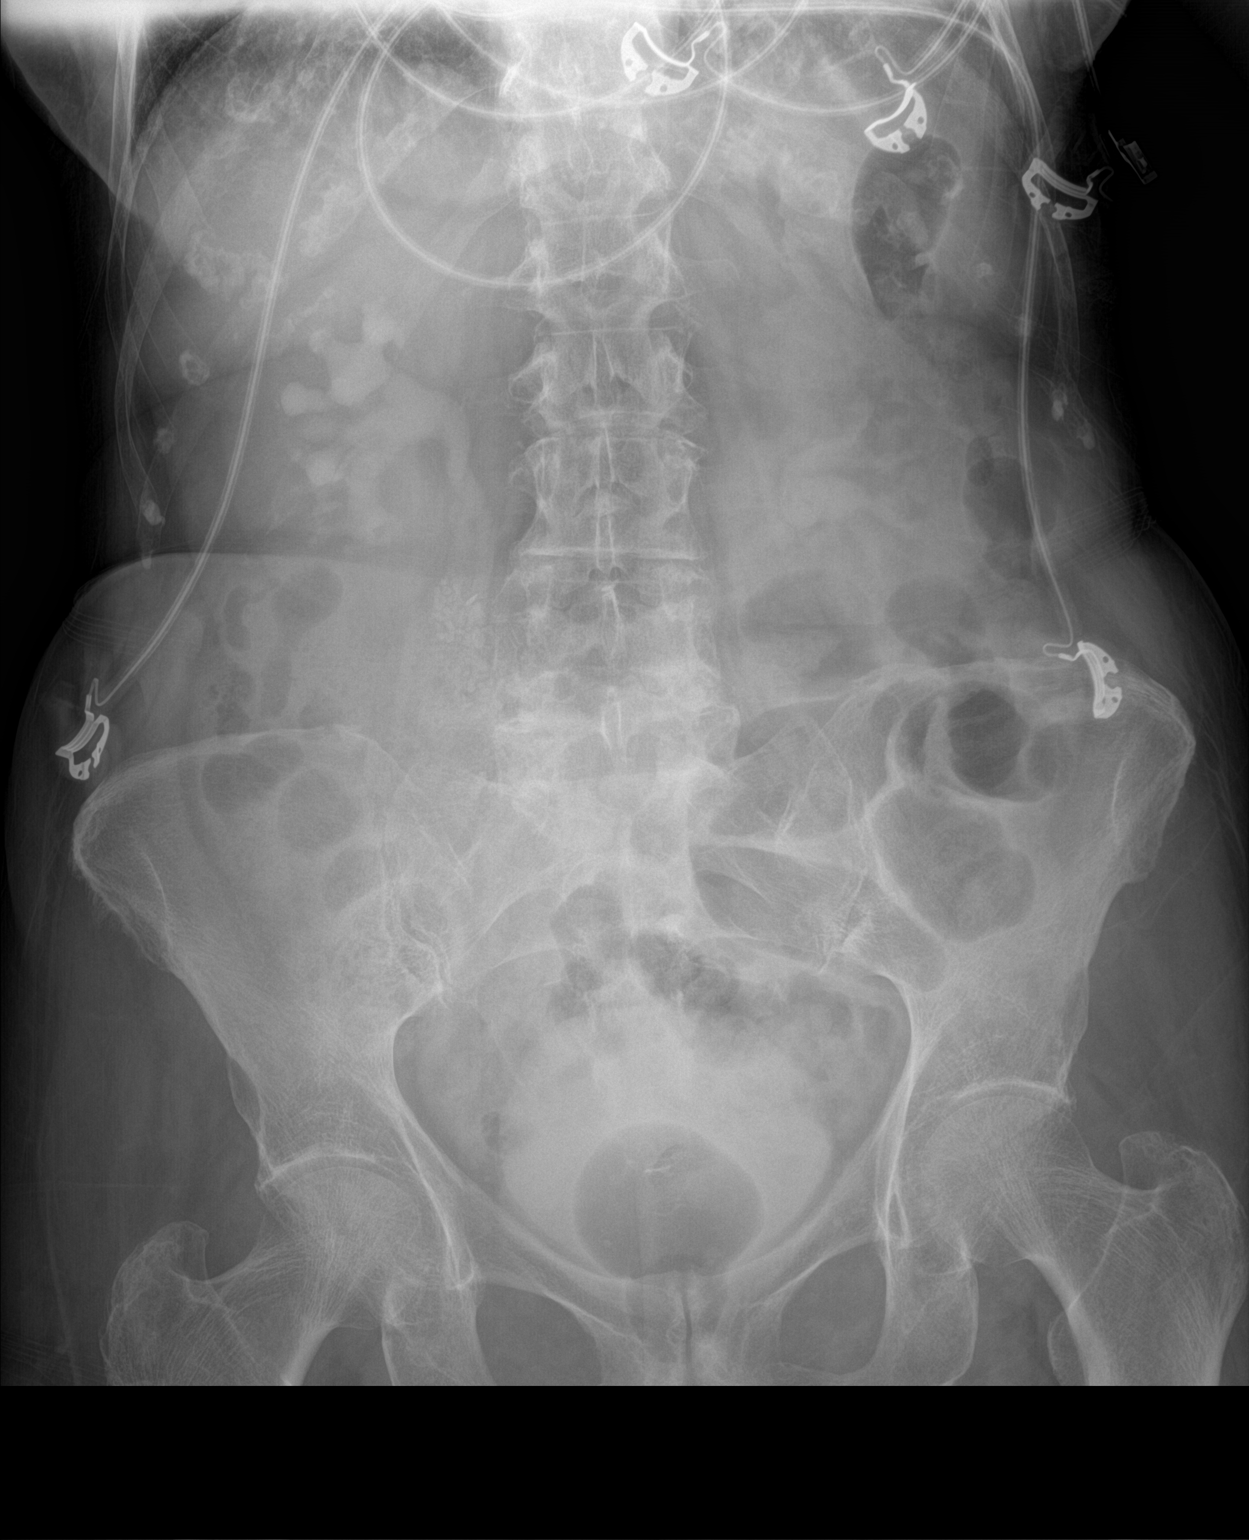

[1 of 1 positions shown; findings below may reference images not displayed]

FINDINGS: The bowel gas pattern is normal. Moderate right-sided hydronephrosis
is present with asymmetric contrast in the right renal collecting
system. The stone is opacified by in the contrast in the urinary
bladder.

Multiple gallstones are again seen. Degenerative changes are present
in the lumbar spine.
IMPRESSION: 1. Moderate right-sided hydronephrosis with opacification of the
collecting system.
2. The distal right ureteral stone is obscured by bladder contrast.
3. Cholelithiasis.

## 2020-03-29 ENCOUNTER — Ambulatory Visit: Payer: BC Managed Care – PPO | Attending: Internal Medicine

## 2020-03-29 DIAGNOSIS — Z23 Encounter for immunization: Secondary | ICD-10-CM

## 2020-03-29 NOTE — Progress Notes (Signed)
   Covid-19 Vaccination Clinic  Name:  Tymber Stallings    MRN: [id number] DOB: [birth year]  03/29/2020  Ms. Ladley was observed post Covid-19 immunization for 15 minutes without incident. She was provided with Vaccine Information Sheet and instruction to access the V-Safe system.   Ms. Lysne was instructed to call 911 with any severe reactions post vaccine: Marland Kitchen Difficulty breathing  . Swelling of face and throat  . A fast heartbeat  . A bad rash all over body  . Dizziness and weakness

## 2020-07-20 DIAGNOSIS — M5030 Other cervical disc degeneration, unspecified cervical region: Secondary | ICD-10-CM | POA: Diagnosis not present

## 2022-10-24 DIAGNOSIS — R3129 Other microscopic hematuria: Secondary | ICD-10-CM | POA: Diagnosis not present

## 2022-10-24 DIAGNOSIS — K8020 Calculus of gallbladder without cholecystitis without obstruction: Secondary | ICD-10-CM | POA: Diagnosis not present

## 2022-10-24 DIAGNOSIS — N281 Cyst of kidney, acquired: Secondary | ICD-10-CM | POA: Diagnosis not present

## 2022-10-24 DIAGNOSIS — N200 Calculus of kidney: Secondary | ICD-10-CM | POA: Diagnosis not present

## 2022-11-01 DIAGNOSIS — R3129 Other microscopic hematuria: Secondary | ICD-10-CM | POA: Diagnosis not present

## 2022-11-01 DIAGNOSIS — N200 Calculus of kidney: Secondary | ICD-10-CM | POA: Diagnosis not present

## 2023-01-15 DIAGNOSIS — M5412 Radiculopathy, cervical region: Secondary | ICD-10-CM | POA: Diagnosis not present

## 2023-01-15 DIAGNOSIS — Z5181 Encounter for therapeutic drug level monitoring: Secondary | ICD-10-CM | POA: Diagnosis not present

## 2023-01-15 DIAGNOSIS — M545 Low back pain, unspecified: Secondary | ICD-10-CM | POA: Diagnosis not present

## 2023-03-01 DIAGNOSIS — Z23 Encounter for immunization: Secondary | ICD-10-CM | POA: Diagnosis not present

## 2023-03-04 DIAGNOSIS — M542 Cervicalgia: Secondary | ICD-10-CM | POA: Diagnosis not present

## 2023-03-04 DIAGNOSIS — G8929 Other chronic pain: Secondary | ICD-10-CM | POA: Diagnosis not present

## 2023-03-07 DIAGNOSIS — M542 Cervicalgia: Secondary | ICD-10-CM | POA: Diagnosis not present

## 2023-03-11 DIAGNOSIS — M542 Cervicalgia: Secondary | ICD-10-CM | POA: Diagnosis not present

## 2023-03-13 DIAGNOSIS — M542 Cervicalgia: Secondary | ICD-10-CM | POA: Diagnosis not present

## 2023-03-18 DIAGNOSIS — M542 Cervicalgia: Secondary | ICD-10-CM | POA: Diagnosis not present

## 2023-03-20 DIAGNOSIS — M542 Cervicalgia: Secondary | ICD-10-CM | POA: Diagnosis not present

## 2023-03-25 DIAGNOSIS — M542 Cervicalgia: Secondary | ICD-10-CM | POA: Diagnosis not present

## 2023-03-27 DIAGNOSIS — M542 Cervicalgia: Secondary | ICD-10-CM | POA: Diagnosis not present

## 2023-04-01 DIAGNOSIS — M542 Cervicalgia: Secondary | ICD-10-CM | POA: Diagnosis not present

## 2023-04-03 DIAGNOSIS — M542 Cervicalgia: Secondary | ICD-10-CM | POA: Diagnosis not present

## 2023-04-08 DIAGNOSIS — M542 Cervicalgia: Secondary | ICD-10-CM | POA: Diagnosis not present

## 2023-04-11 DIAGNOSIS — M542 Cervicalgia: Secondary | ICD-10-CM | POA: Diagnosis not present

## 2023-04-15 DIAGNOSIS — M542 Cervicalgia: Secondary | ICD-10-CM | POA: Diagnosis not present

## 2023-04-17 DIAGNOSIS — M542 Cervicalgia: Secondary | ICD-10-CM | POA: Diagnosis not present

## 2023-04-22 DIAGNOSIS — M542 Cervicalgia: Secondary | ICD-10-CM | POA: Diagnosis not present

## 2023-04-24 DIAGNOSIS — M542 Cervicalgia: Secondary | ICD-10-CM | POA: Diagnosis not present

## 2023-05-01 DIAGNOSIS — M542 Cervicalgia: Secondary | ICD-10-CM | POA: Diagnosis not present

## 2023-05-13 DIAGNOSIS — M542 Cervicalgia: Secondary | ICD-10-CM | POA: Diagnosis not present

## 2023-05-14 DIAGNOSIS — M5412 Radiculopathy, cervical region: Secondary | ICD-10-CM | POA: Diagnosis not present

## 2023-05-14 DIAGNOSIS — Z5181 Encounter for therapeutic drug level monitoring: Secondary | ICD-10-CM | POA: Diagnosis not present

## 2023-05-14 DIAGNOSIS — M542 Cervicalgia: Secondary | ICD-10-CM | POA: Diagnosis not present

## 2023-05-14 DIAGNOSIS — M545 Low back pain, unspecified: Secondary | ICD-10-CM | POA: Diagnosis not present

## 2023-05-14 DIAGNOSIS — Z79899 Other long term (current) drug therapy: Secondary | ICD-10-CM | POA: Diagnosis not present

## 2023-05-27 DIAGNOSIS — M542 Cervicalgia: Secondary | ICD-10-CM | POA: Diagnosis not present

## 2023-05-29 DIAGNOSIS — M542 Cervicalgia: Secondary | ICD-10-CM | POA: Diagnosis not present

## 2023-06-03 DIAGNOSIS — M542 Cervicalgia: Secondary | ICD-10-CM | POA: Diagnosis not present

## 2023-06-05 DIAGNOSIS — M542 Cervicalgia: Secondary | ICD-10-CM | POA: Diagnosis not present

## 2023-06-10 DIAGNOSIS — M542 Cervicalgia: Secondary | ICD-10-CM | POA: Diagnosis not present

## 2023-06-12 DIAGNOSIS — M542 Cervicalgia: Secondary | ICD-10-CM | POA: Diagnosis not present

## 2023-06-20 DIAGNOSIS — M542 Cervicalgia: Secondary | ICD-10-CM | POA: Diagnosis not present

## 2023-06-24 DIAGNOSIS — M542 Cervicalgia: Secondary | ICD-10-CM | POA: Diagnosis not present

## 2023-06-27 DIAGNOSIS — M542 Cervicalgia: Secondary | ICD-10-CM | POA: Diagnosis not present

## 2023-07-01 DIAGNOSIS — M542 Cervicalgia: Secondary | ICD-10-CM | POA: Diagnosis not present

## 2023-07-04 DIAGNOSIS — M542 Cervicalgia: Secondary | ICD-10-CM | POA: Diagnosis not present

## 2023-07-09 DIAGNOSIS — M542 Cervicalgia: Secondary | ICD-10-CM | POA: Diagnosis not present

## 2023-07-11 DIAGNOSIS — M542 Cervicalgia: Secondary | ICD-10-CM | POA: Diagnosis not present

## 2023-07-18 DIAGNOSIS — M542 Cervicalgia: Secondary | ICD-10-CM | POA: Diagnosis not present

## 2023-07-22 DIAGNOSIS — M542 Cervicalgia: Secondary | ICD-10-CM | POA: Diagnosis not present

## 2023-07-25 DIAGNOSIS — M542 Cervicalgia: Secondary | ICD-10-CM | POA: Diagnosis not present

## 2023-07-31 DIAGNOSIS — M542 Cervicalgia: Secondary | ICD-10-CM | POA: Diagnosis not present

## 2023-07-31 DIAGNOSIS — R2689 Other abnormalities of gait and mobility: Secondary | ICD-10-CM | POA: Diagnosis not present

## 2023-08-18 ENCOUNTER — Other Ambulatory Visit: Payer: Self-pay

## 2023-08-18 ENCOUNTER — Emergency Department (HOSPITAL_COMMUNITY): Payer: Medicare PPO

## 2023-08-18 ENCOUNTER — Emergency Department (HOSPITAL_COMMUNITY)
Admission: EM | Admit: 2023-08-18 | Discharge: 2023-08-18 | Disposition: A | Discharge status: Home / Self Care | Payer: Medicare PPO | Attending: Emergency Medicine | Admitting: Emergency Medicine

## 2023-08-18 DIAGNOSIS — K5730 Diverticulosis of large intestine without perforation or abscess without bleeding: Secondary | ICD-10-CM | POA: Diagnosis not present

## 2023-08-18 DIAGNOSIS — M47816 Spondylosis without myelopathy or radiculopathy, lumbar region: Secondary | ICD-10-CM | POA: Diagnosis not present

## 2023-08-18 DIAGNOSIS — M545 Low back pain, unspecified: Secondary | ICD-10-CM | POA: Diagnosis present

## 2023-08-18 DIAGNOSIS — N200 Calculus of kidney: Secondary | ICD-10-CM | POA: Diagnosis not present

## 2023-08-18 DIAGNOSIS — K5900 Constipation, unspecified: Secondary | ICD-10-CM | POA: Insufficient documentation

## 2023-08-18 DIAGNOSIS — R350 Frequency of micturition: Secondary | ICD-10-CM | POA: Diagnosis not present

## 2023-08-18 DIAGNOSIS — K8020 Calculus of gallbladder without cholecystitis without obstruction: Secondary | ICD-10-CM | POA: Diagnosis not present

## 2023-08-18 DIAGNOSIS — R109 Unspecified abdominal pain: Secondary | ICD-10-CM | POA: Diagnosis not present

## 2023-08-18 DIAGNOSIS — S3992XA Unspecified injury of lower back, initial encounter: Secondary | ICD-10-CM | POA: Diagnosis not present

## 2023-08-18 DIAGNOSIS — S32010A Wedge compression fracture of first lumbar vertebra, initial encounter for closed fracture: Secondary | ICD-10-CM | POA: Diagnosis not present

## 2023-08-18 DIAGNOSIS — W010XXA Fall on same level from slipping, tripping and stumbling without subsequent striking against object, initial encounter: Secondary | ICD-10-CM | POA: Insufficient documentation

## 2023-08-18 DIAGNOSIS — M4856XA Collapsed vertebra, not elsewhere classified, lumbar region, initial encounter for fracture: Secondary | ICD-10-CM | POA: Diagnosis not present

## 2023-08-18 DIAGNOSIS — M549 Dorsalgia, unspecified: Secondary | ICD-10-CM | POA: Diagnosis not present

## 2023-08-18 DIAGNOSIS — M898X8 Other specified disorders of bone, other site: Secondary | ICD-10-CM | POA: Diagnosis not present

## 2023-08-18 LAB — COMPREHENSIVE METABOLIC PANEL
ALT: 15 U/L (ref 0–44)
AST: 22 U/L (ref 15–41)
Albumin: 4 g/dL (ref 3.5–5.0)
Alkaline Phosphatase: 73 U/L (ref 38–126)
Anion gap: 9 (ref 5–15)
BUN: 12 mg/dL (ref 8–23)
CO2: 26 mmol/L (ref 22–32)
Calcium: 9.2 mg/dL (ref 8.9–10.3)
Chloride: 105 mmol/L (ref 98–111)
Creatinine, Ser: 0.65 mg/dL (ref 0.44–1.00)
GFR, Estimated: >60 mL/min (ref >60)
Glucose, Bld: 105 mg/dL — ABNORMAL HIGH (ref 70–99)
Potassium: 3.7 mmol/L (ref 3.5–5.1)
Sodium: 140 mmol/L (ref 135–145)
Total Bilirubin: 0.8 mg/dL (ref 0.0–1.2)
Total Protein: 7.4 g/dL (ref 6.5–8.1)

## 2023-08-18 LAB — CBC WITH DIFFERENTIAL/PLATELET
Abs Immature Granulocytes: 0.02 10*3/uL (ref 0.00–0.07)
Basophils Absolute: 0.1 10*3/uL (ref 0.0–0.1)
Basophils Relative: 1 %
Eosinophils Absolute: 0.1 10*3/uL (ref 0.0–0.5)
Eosinophils Relative: 2 %
HCT: 37.3 % (ref 36.0–46.0)
Hemoglobin: 11.8 g/dL — ABNORMAL LOW (ref 12.0–15.0)
Immature Granulocytes: 0 %
Lymphocytes Relative: 14 %
Lymphs Abs: 0.7 10*3/uL (ref 0.7–4.0)
MCH: 30.6 pg (ref 26.0–34.0)
MCHC: 31.6 g/dL (ref 30.0–36.0)
MCV: 96.6 fL (ref 80.0–100.0)
Monocytes Absolute: 0.7 10*3/uL (ref 0.1–1.0)
Monocytes Relative: 13 %
Neutro Abs: 3.5 10*3/uL (ref 1.7–7.7)
Neutrophils Relative %: 70 %
Platelets: 282 10*3/uL (ref 150–400)
RBC: 3.86 MIL/uL — ABNORMAL LOW (ref 3.87–5.11)
RDW: 12.5 % (ref 11.5–15.5)
WBC: 5.1 10*3/uL (ref 4.0–10.5)
nRBC: 0 % (ref 0.0–0.2)

## 2023-08-18 LAB — LIPASE, BLOOD: Lipase: 41 U/L (ref 11–51)

## 2023-08-18 MED ORDER — OXYCODONE-ACETAMINOPHEN 5-325 MG PO TABS: 1.0000 | ORAL_TABLET | Freq: Once | ORAL | Status: DC

## 2023-08-18 MED ORDER — LORAZEPAM 2 MG/ML IJ SOLN
0.5000 mg | INTRAMUSCULAR | Status: DC | PRN
Start: 2023-08-18 — End: 2023-08-19

## 2023-08-18 MED ORDER — IOHEXOL 300 MG/ML  SOLN
100.0000 mL | Freq: Once | INTRAMUSCULAR | Status: AC | PRN
  Administered 2023-08-18: 80 mL via INTRAVENOUS

## 2023-08-18 NOTE — ED Provider Notes (Signed)
 [facility] Provider Note   CSN: [id number] Arrival date & time: 08/18/23  1346     History  Chief Complaint  Patient presents with   Fall   Back Pain   Constipation    Vanessa Cannon is a 88 y.o. female.   Fall  Back Pain Constipation Associated symptoms: back pain      88 year old female with medical history Significant for anxiety, kidney stones, gait instability requiring outpatient physical therapy who presents to the emergency department after a fall.  The patient states that she spilled water and slipped on the spilled water on 08/14/2023.  She landed on her tailbone/low back.  She initially had no pain but has gradually had worsening low back pain over the same amount of time.  Additionally, the patient has not had a bowel movement since 2/19.  She has tried home suppositories without relief with nothing in the rectal vault.  She additionally states that as of the past day she has not been passing gas.  She endorses urinary frequency, denies dysuria.  She denies any saddle anesthesia, no weakness in the lower extremities.  Did wake up with numbness which occasionally happens due to degeneration in her cervical spine.  She endorses moderate pain in her low back.  She normally ambulates without assistance and has been ambulatory since the fall.  She denies head trauma during the fall, no loss of consciousness, is not on anticoagulation.  She arrives GCS 15, ABC intact.  Home Medications Prior to Admission medications   Medication Sig Start Date End Date Taking? Authorizing Provider  Acetylcarnitine HCl (ACETYL L-CARNITINE PO) Take by mouth.    [provider]  albuterol (PROVENTIL HFA;VENTOLIN HFA) 108 (90 Base) MCG/ACT inhaler Inhale 1-2 puffs into the lungs every 6 (six) hours as needed for wheezing or shortness of breath.    [provider]  ASTRAGALUS PO Take by mouth.    [provider]   cholecalciferol (VITAMIN D) 1000 UNITS tablet Take 1,000 Units by mouth daily.    [provider]  diazepam (VALIUM) 5 MG tablet Take 5 mg by mouth every 6 (six) hours as needed for anxiety.    [provider]  Multiple Vitamin (MULTIVITAMIN WITH MINERALS) TABS tablet Take 1 tablet by mouth daily.    [provider]  Multiple Vitamins-Minerals (PRESERVISION AREDS PO) Take by mouth.    [provider]  Omega-3 Fatty Acids (FISH OIL) 1000 MG CAPS Take by mouth.    [provider]  traMADol (ULTRAM) 50 MG tablet Take 1 tablet (50 mg total) by mouth every 6 (six) hours as needed for severe pain. 09/09/19   Vanessa Miyamoto, MD  vitamin C (ASCORBIC ACID) 250 MG tablet Take 250 mg by mouth daily.    [provider]      Allergies    Ceclor [cefaclor]    Review of Systems   Review of Systems  Gastrointestinal:  Positive for constipation.  Genitourinary:  Positive for frequency.  Musculoskeletal:  Positive for back pain.  All other systems reviewed and are negative.   Physical Exam Updated Vital Signs BP (!) 174/68 (BP Location: Left Arm)   Pulse 92   Temp 98.1 F (36.7 C) (Oral)   Resp 18   Ht 5\' 2"  (1.575 m)   Wt 54.4 kg   SpO2 98%   BMI 21.95 kg/m  Physical Exam Vitals and nursing note reviewed.  Constitutional:  General: She is not in acute distress.    Appearance: She is well-developed.     Comments: GCS 15, ABC intact  HENT:     Head: Normocephalic and atraumatic.  Eyes:     Extraocular Movements: Extraocular movements intact.     Conjunctiva/sclera: Conjunctivae normal.     Pupils: Pupils are equal, round, and reactive to light.  Neck:     Comments: No midline tenderness to palpation of the cervical spine.  Range of motion intact Cardiovascular:     Rate and Rhythm: Normal rate and regular rhythm.  Pulmonary:     Effort: Pulmonary effort is normal. No respiratory distress.     Breath sounds: Normal breath  sounds.  Chest:     Comments: Clavicles stable nontender to AP compression.  Chest wall stable and nontender to AP and lateral compression. Abdominal:     Palpations: Abdomen is soft.     Tenderness: There is no abdominal tenderness.     Comments: Pelvis stable to lateral compression  Musculoskeletal:     Cervical back: Neck supple.     Comments: No midline tenderness to palpation of the thoracic spine.  Mild midline tenderness of the lower lumbar spine/sacrum.  Extremities atraumatic with intact range of motion  Skin:    General: Skin is warm and dry.  Neurological:     Mental Status: She is alert.     Comments: Cranial nerves II through XII grossly intact.  Moving all 4 extremities spontaneously.  Sensation grossly intact all 4 extremities     ED Results / Procedures / Treatments   Labs (all labs ordered are listed, but only abnormal results are displayed) Labs Reviewed  CBC WITH DIFFERENTIAL/PLATELET - Abnormal; Notable for the following components:      Result Value   RBC 3.86 (*)    Hemoglobin 11.8 (*)    All other components within normal limits  COMPREHENSIVE METABOLIC PANEL - Abnormal; Notable for the following components:   Glucose, Bld 105 (*)    All other components within normal limits  LIPASE, BLOOD  URINALYSIS, ROUTINE W REFLEX MICROSCOPIC    EKG None  Radiology MR LUMBAR SPINE WO CONTRAST Result Date: 08/18/2023 CLINICAL DATA:  Back trauma, abnormal neuro exam, CT or xray positive (Age >= 16y) EXAM: MRI LUMBAR SPINE WITHOUT CONTRAST TECHNIQUE: Multiplanar, multisequence MR imaging of the lumbar spine was performed. No intravenous contrast was administered. COMPARISON:  CT 08/18/2023 FINDINGS: Segmentation:  Standard. Alignment: Grade 1-2 anterolisthesis of L4 on L5. Grade 1 anterolisthesis of L5 on S1. Trace retrolisthesis at L2-3. Vertebrae: Acute mild superior endplate compression fracture of the L1 vertebral body with approximately 25% vertebral body height  loss. 4 mm of retropulsion at the superior endplate. No fracture extension into the posterior elements. Remaining vertebral body heights are maintained. No additional fractures. Scattered intraosseous hemangiomas. No suspicious bone lesion. No evidence of discitis. Conus medullaris and cauda equina: Conus extends to the L1-2 level. Conus and cauda equina appear normal. Paraspinal and other soft tissues: Cholelithiasis. Colonic diverticulosis. No acute findings. Disc levels: Facet predominant multilevel cervical spondylosis with severe facet arthropathy at L4-5. Multilevel disc space narrowing. Canal is widely patent at all levels. There is very slight right-sided foraminal narrowing at L2-L3 without significant stenosis. IMPRESSION: 1. Acute mild superior endplate compression fracture of the L1 vertebral body with approximately 25% vertebral body height loss. 4 mm of retropulsion at the superior endplate. 2. Multilevel lumbar spondylosis, most pronounced at L4-5 where severe facet arthropathy  contributes to grade 1-2 anterolisthesis. No significant canal or foraminal stenosis at any level. 3. Cholelithiasis. 4. Colonic diverticulosis. Electronically Signed   By: Duanne Guess D.O.   On: 08/18/2023 20:11   CT L-SPINE NO CHARGE Result Date: 08/18/2023 CLINICAL DATA:  Larey Seat 08/14/2023, back pain EXAM: CT Lumbar Spine with contrast TECHNIQUE: Technique: Multiplanar CT images of the lumbar spine were reconstructed from contemporary CT of the Abdomen and Pelvis. RADIATION DOSE REDUCTION: This exam was performed according to the departmental dose-optimization program which includes automated exposure control, adjustment of the mA and/or kV according to patient size and/or use of iterative reconstruction technique. CONTRAST:  No additional COMPARISON:  10/24/2022 FINDINGS: Segmentation: 5 lumbar type vertebrae. Alignment: Grade 1 degenerative anterolisthesis of L4 on L5. Otherwise alignment is anatomic. Vertebrae:  There is an acute compression deformity through the superior endplate of the L1 vertebral body, with less than 25% loss of height. There is 4 mm of retropulsion at the fracture site. No extension to the pedicles or posterior elements. No other acute or destructive bony abnormalities. Paraspinal and other soft tissues: Paraspinal soft tissues are unremarkable. Cholelithiasis without evidence of acute cholecystitis. Diverticulosis of the sigmoid colon without acute diverticulitis. Disc levels: There is moderate spondylosis at L2-3, L3-4, and L4-5. There is severe facet hypertrophy at L4-5 and L5-S1. Reconstructed images demonstrate no additional findings. IMPRESSION: 1. Acute compression deformity through the superior endplate of the L1 vertebral body, with less than 25% loss of height. 4 mm of retropulsion at the fracture site. 2. Multilevel lumbar spondylosis and facet hypertrophy as above. 3. Cholelithiasis and sigmoid colon diverticulosis as above. Please refer to separately reported CT abdomen and pelvis exam. Electronically Signed   By: Sharlet Salina M.D.   On: 08/18/2023 16:20   CT ABDOMEN PELVIS W CONTRAST Result Date: 08/18/2023 CLINICAL DATA:  Acute abdominal pain, fell 08/14/2023, lower back pain EXAM: CT ABDOMEN AND PELVIS WITH CONTRAST TECHNIQUE: Multidetector CT imaging of the abdomen and pelvis was performed using the standard protocol following bolus administration of intravenous contrast. RADIATION DOSE REDUCTION: This exam was performed according to the departmental dose-optimization program which includes automated exposure control, adjustment of the mA and/or kV according to patient size and/or use of iterative reconstruction technique. CONTRAST:  80mL OMNIPAQUE IOHEXOL 300 MG/ML  SOLN COMPARISON:  10/24/2022 FINDINGS: Lower chest: Stable scattered emphysematous changes. No acute pleural or parenchymal lung disease. The heart is enlarged with stable calcification of the mitral annulus. Trace  pericardial fluid. Hepatobiliary: Multiple calcified gallstones without evidence of acute cholecystitis. Liver is unremarkable. No biliary duct dilation or choledocholithiasis. Pancreas: Unremarkable. No pancreatic ductal dilatation or surrounding inflammatory changes. Spleen: Normal in size without focal abnormality. Adrenals/Urinary Tract: There is a nonobstructing 3 mm calculus within the lower pole right kidney. No other urinary tract calculi or signs of obstructive uropathy. Stable small bilateral renal cortical cysts do not require specific imaging follow-up. Stable thickening of the adrenal glands consistent with hyperplasia. Bladder is decompressed, limiting its evaluation. Stomach/Bowel: No bowel obstruction or ileus. Normal appendix right lower quadrant. Diverticulosis of the sigmoid colon without evidence of acute diverticulitis. No bowel wall thickening or inflammatory change. Small hiatal hernia. Vascular/Lymphatic: Aortic atherosclerosis. No enlarged abdominal or pelvic lymph nodes. Reproductive: Status post hysterectomy. No adnexal masses. Other: No free fluid or free intraperitoneal gas. Small fat containing umbilical hernia. No bowel herniation. Musculoskeletal: Acute appearing compression deformity involving the superior endplate of the L1 vertebral body, with less than 25% loss of height. 4  mm of retropulsion at the fracture site. No extension into the pedicles or posterior elements. No other acute bony abnormalities. Stable grade 1 degenerative anterolisthesis of L4 on L5. Stable multilevel lumbar spondylosis and facet hypertrophy. Reconstructed images demonstrate no additional findings. IMPRESSION: 1. Acute compression deformity within the superior endplate of the L1 vertebral body, with less than 25% loss of height. 4 mm of retropulsion at the fracture site. 2. Cholelithiasis without evidence of acute cholecystitis. 3. Sigmoid diverticulosis without diverticulitis. 4. Cardiomegaly, with trace  pericardial effusion. 5. Nonobstructing 3 mm right renal calculus. 6. Aortic Atherosclerosis (ICD10-I70.0) and Emphysema (ICD10-J43.9). Electronically Signed   By: Sharlet Salina M.D.   On: 08/18/2023 16:16    Procedures Procedures    Medications Ordered in ED Medications  LORazepam (ATIVAN) injection 0.5 mg (has no administration in time range)  iohexol (OMNIPAQUE) 300 MG/ML solution 100 mL (80 mLs Intravenous Contrast Given 08/18/23 1550)    ED Course/ Medical Decision Making/ A&P                                 Medical Decision Making Amount and/or Complexity of Data Reviewed Labs: ordered. Radiology: ordered.  Risk Prescription drug management.   88 year old female with medical history Significant for anxiety, kidney stones, gait instability requiring outpatient physical therapy who presents to the emergency department after a fall.  The patient states that she spilled water and slipped on the spilled water on 08/14/2023.  She landed on her tailbone/low back.  She initially had no pain but has gradually had worsening low back pain over the same amount of time.  Additionally, the patient has not had a bowel movement since 2/19.  She has tried home suppositories without relief with nothing in the rectal vault.  She additionally states that as of the past day she has not been passing gas.  She endorses urinary frequency, denies dysuria.  She denies any saddle anesthesia, no weakness in the lower extremities.  Did wake up with numbness which occasionally happens due to degeneration in her cervical spine.  She endorses moderate pain in her low back.  She normally ambulates without assistance and has been ambulatory since the fall.  She denies head trauma during the fall, no loss of consciousness, is not on anticoagulation.  She arrives GCS 15, ABC intact.  On arrival, the patient was vitally stable, did have tenderness to palpation of the midline of the lower lumbar spine.  Concern for  fracture in the setting of patient's fall.  CT imaging of the abdomen pelvis and L-spine revealed evidence of an L1 compression fracture with bony retropulsion.  The patient has had constipation, no urinary retention.  She is ambulatory and has no weakness.  MR Lumbar Spine: IMPRESSION:  1. Acute mild superior endplate compression fracture of the L1  vertebral body with approximately 25% vertebral body height loss. 4  mm of retropulsion at the superior endplate.  2. Multilevel lumbar spondylosis, most pronounced at L4-5 where  severe facet arthropathy contributes to grade 1-2 anterolisthesis.  No significant canal or foraminal stenosis at any level.  3. Cholelithiasis.  4. Colonic diverticulosis.    Patient was ambulatory in the Emergency Department without discomfort.  She already has Norco prescribed for pain control and only takes half tablet daily.  I recommended that she escalate to a whole tablet daily for the next few days and follow-up with her primary care provider for further  outpatient management.  Stable for discharge.   Final Clinical Impression(s) / ED Diagnoses Final diagnoses:  Closed compression fracture of body of L1 vertebra [facility])    Rx / DC Orders ED Discharge Orders     None         Ernie Avena, MD 08/18/23 2041

## 2023-08-18 NOTE — Discharge Instructions (Addendum)
 You CT imaging revealed a compression fracture in the spine and your MRI showed now evidence of spinal cord involvement.  For pain control, take a full tablet of Norco rather than your usual half tablet of Norco and follow-up with your primary care physician. IMPRESSION:  1. Acute mild superior endplate compression fracture of the L1  vertebral body with approximately 25% vertebral body height loss. 4  mm of retropulsion at the superior endplate.  2. Multilevel lumbar spondylosis, most pronounced at L4-5 where  severe facet arthropathy contributes to grade 1-2 anterolisthesis.  No significant canal or foraminal stenosis at any level.  3. Cholelithiasis.  4. Colonic diverticulosis.

## 2023-08-18 NOTE — ED Triage Notes (Signed)
 Patient to ED by POV following a fall that happened 08/14/23. Patient states she fell on Monday did not have pain until yesterday in lower back. She did not hit her head, no thinners or LOC. She also c/o constipation states LBM: 08/13/22, took suppository with not relief.

## 2023-08-19 DIAGNOSIS — R2689 Other abnormalities of gait and mobility: Secondary | ICD-10-CM | POA: Diagnosis not present

## 2023-08-19 DIAGNOSIS — M542 Cervicalgia: Secondary | ICD-10-CM | POA: Diagnosis not present

## 2023-08-20 DIAGNOSIS — M51369 Other intervertebral disc degeneration, lumbar region without mention of lumbar back pain or lower extremity pain: Secondary | ICD-10-CM | POA: Diagnosis not present

## 2023-08-20 DIAGNOSIS — M810 Age-related osteoporosis without current pathological fracture: Secondary | ICD-10-CM | POA: Diagnosis not present

## 2023-08-20 DIAGNOSIS — M4856XD Collapsed vertebra, not elsewhere classified, lumbar region, subsequent encounter for fracture with routine healing: Secondary | ICD-10-CM | POA: Diagnosis not present

## 2023-08-27 DIAGNOSIS — F411 Generalized anxiety disorder: Secondary | ICD-10-CM | POA: Diagnosis not present

## 2023-08-27 DIAGNOSIS — R1310 Dysphagia, unspecified: Secondary | ICD-10-CM | POA: Diagnosis not present

## 2023-08-27 DIAGNOSIS — K219 Gastro-esophageal reflux disease without esophagitis: Secondary | ICD-10-CM | POA: Diagnosis not present

## 2023-08-27 DIAGNOSIS — M8589 Other specified disorders of bone density and structure, multiple sites: Secondary | ICD-10-CM | POA: Diagnosis not present

## 2023-08-27 DIAGNOSIS — E559 Vitamin D deficiency, unspecified: Secondary | ICD-10-CM | POA: Diagnosis not present

## 2023-08-27 DIAGNOSIS — E7800 Pure hypercholesterolemia, unspecified: Secondary | ICD-10-CM | POA: Diagnosis not present

## 2023-08-27 DIAGNOSIS — M8000XG Age-related osteoporosis with current pathological fracture, unspecified site, subsequent encounter for fracture with delayed healing: Secondary | ICD-10-CM | POA: Diagnosis not present

## 2023-09-02 DIAGNOSIS — M542 Cervicalgia: Secondary | ICD-10-CM | POA: Diagnosis not present

## 2023-09-02 DIAGNOSIS — R2689 Other abnormalities of gait and mobility: Secondary | ICD-10-CM | POA: Diagnosis not present

## 2023-09-04 ENCOUNTER — Other Ambulatory Visit: Payer: Self-pay | Admitting: Gastroenterology

## 2023-09-04 DIAGNOSIS — R1311 Dysphagia, oral phase: Secondary | ICD-10-CM | POA: Diagnosis not present

## 2023-09-04 DIAGNOSIS — K5901 Slow transit constipation: Secondary | ICD-10-CM | POA: Diagnosis not present

## 2023-09-04 DIAGNOSIS — R1310 Dysphagia, unspecified: Secondary | ICD-10-CM

## 2023-09-09 DIAGNOSIS — M542 Cervicalgia: Secondary | ICD-10-CM | POA: Diagnosis not present

## 2023-09-09 DIAGNOSIS — R2689 Other abnormalities of gait and mobility: Secondary | ICD-10-CM | POA: Diagnosis not present

## 2023-09-10 DIAGNOSIS — J4520 Mild intermittent asthma, uncomplicated: Secondary | ICD-10-CM | POA: Diagnosis not present

## 2023-09-10 DIAGNOSIS — Z0000 Encounter for general adult medical examination without abnormal findings: Secondary | ICD-10-CM | POA: Diagnosis not present

## 2023-09-10 DIAGNOSIS — R1310 Dysphagia, unspecified: Secondary | ICD-10-CM | POA: Diagnosis not present

## 2023-09-10 DIAGNOSIS — E559 Vitamin D deficiency, unspecified: Secondary | ICD-10-CM | POA: Diagnosis not present

## 2023-09-10 DIAGNOSIS — K219 Gastro-esophageal reflux disease without esophagitis: Secondary | ICD-10-CM | POA: Diagnosis not present

## 2023-09-10 DIAGNOSIS — S32010S Wedge compression fracture of first lumbar vertebra, sequela: Secondary | ICD-10-CM | POA: Diagnosis not present

## 2023-09-11 DIAGNOSIS — M5412 Radiculopathy, cervical region: Secondary | ICD-10-CM | POA: Diagnosis not present

## 2023-09-11 DIAGNOSIS — M4856XD Collapsed vertebra, not elsewhere classified, lumbar region, subsequent encounter for fracture with routine healing: Secondary | ICD-10-CM | POA: Diagnosis not present

## 2023-09-11 DIAGNOSIS — M542 Cervicalgia: Secondary | ICD-10-CM | POA: Diagnosis not present

## 2023-09-17 DIAGNOSIS — M542 Cervicalgia: Secondary | ICD-10-CM | POA: Diagnosis not present

## 2023-09-17 DIAGNOSIS — R2689 Other abnormalities of gait and mobility: Secondary | ICD-10-CM | POA: Diagnosis not present

## 2023-09-24 ENCOUNTER — Ambulatory Visit
Admission: RE | Admit: 2023-09-24 | Discharge: 2023-09-24 | Disposition: A | Discharge status: Home / Self Care | Source: Ambulatory Visit | Attending: Gastroenterology | Admitting: Gastroenterology

## 2023-09-24 DIAGNOSIS — R1310 Dysphagia, unspecified: Secondary | ICD-10-CM

## 2023-09-24 DIAGNOSIS — K2289 Other specified disease of esophagus: Secondary | ICD-10-CM | POA: Diagnosis not present

## 2023-09-24 DIAGNOSIS — K449 Diaphragmatic hernia without obstruction or gangrene: Secondary | ICD-10-CM | POA: Diagnosis not present

## 2023-09-25 DIAGNOSIS — R2689 Other abnormalities of gait and mobility: Secondary | ICD-10-CM | POA: Diagnosis not present

## 2023-09-25 DIAGNOSIS — M542 Cervicalgia: Secondary | ICD-10-CM | POA: Diagnosis not present

## 2023-10-02 ENCOUNTER — Encounter (HOSPITAL_COMMUNITY): Payer: Self-pay

## 2023-10-02 ENCOUNTER — Emergency Department (HOSPITAL_COMMUNITY)
Admission: EM | Admit: 2023-10-02 | Discharge: 2023-10-03 | Disposition: A | Discharge status: Home / Self Care | Attending: Emergency Medicine | Admitting: Emergency Medicine

## 2023-10-02 ENCOUNTER — Other Ambulatory Visit: Payer: Self-pay

## 2023-10-02 DIAGNOSIS — K5900 Constipation, unspecified: Secondary | ICD-10-CM | POA: Insufficient documentation

## 2023-10-02 DIAGNOSIS — R1033 Periumbilical pain: Secondary | ICD-10-CM | POA: Diagnosis present

## 2023-10-02 DIAGNOSIS — S3991XA Unspecified injury of abdomen, initial encounter: Secondary | ICD-10-CM | POA: Diagnosis not present

## 2023-10-02 DIAGNOSIS — R109 Unspecified abdominal pain: Secondary | ICD-10-CM | POA: Diagnosis not present

## 2023-10-02 DIAGNOSIS — K449 Diaphragmatic hernia without obstruction or gangrene: Secondary | ICD-10-CM | POA: Diagnosis not present

## 2023-10-02 DIAGNOSIS — R2689 Other abnormalities of gait and mobility: Secondary | ICD-10-CM | POA: Diagnosis not present

## 2023-10-02 DIAGNOSIS — M542 Cervicalgia: Secondary | ICD-10-CM | POA: Diagnosis not present

## 2023-10-02 DIAGNOSIS — S3993XA Unspecified injury of pelvis, initial encounter: Secondary | ICD-10-CM | POA: Diagnosis not present

## 2023-10-02 LAB — COMPREHENSIVE METABOLIC PANEL WITH GFR
ALT: 15 U/L (ref 0–44)
AST: 23 U/L (ref 15–41)
Albumin: 4.1 g/dL (ref 3.5–5.0)
Alkaline Phosphatase: 75 U/L (ref 38–126)
Anion gap: 5 (ref 5–15)
BUN: 12 mg/dL (ref 8–23)
CO2: 30 mmol/L (ref 22–32)
Calcium: 9.3 mg/dL (ref 8.9–10.3)
Chloride: 103 mmol/L (ref 98–111)
Creatinine, Ser: 0.69 mg/dL (ref 0.44–1.00)
GFR, Estimated: >60 mL/min (ref >60)
Glucose, Bld: 107 mg/dL — ABNORMAL HIGH (ref 70–99)
Potassium: 3.6 mmol/L (ref 3.5–5.1)
Sodium: 138 mmol/L (ref 135–145)
Total Bilirubin: 0.8 mg/dL (ref 0.0–1.2)
Total Protein: 7.6 g/dL (ref 6.5–8.1)

## 2023-10-02 LAB — CBC
HCT: 38.3 % (ref 36.0–46.0)
Hemoglobin: 12 g/dL (ref 12.0–15.0)
MCH: 30.5 pg (ref 26.0–34.0)
MCHC: 31.3 g/dL (ref 30.0–36.0)
MCV: 97.5 fL (ref 80.0–100.0)
Platelets: 287 10*3/uL (ref 150–400)
RBC: 3.93 MIL/uL (ref 3.87–5.11)
RDW: 12.8 % (ref 11.5–15.5)
WBC: 4.8 10*3/uL (ref 4.0–10.5)
nRBC: 0 % (ref 0.0–0.2)

## 2023-10-02 LAB — LIPASE, BLOOD: Lipase: 47 U/L (ref 11–51)

## 2023-10-02 MED ORDER — ALUM & MAG HYDROXIDE-SIMETH 200-200-20 MG/5ML PO SUSP
30.0000 mL | Freq: Once | ORAL | Status: AC
Start: 2023-10-02 — End: 2023-10-02
  Administered 2023-10-02: 30 mL via ORAL
  Filled 2023-10-02: qty 30

## 2023-10-02 MED ORDER — ONDANSETRON HCL 4 MG/2ML IJ SOLN
4.0000 mg | Freq: Once | INTRAMUSCULAR | Status: AC
  Administered 2023-10-02: 4 mg via INTRAVENOUS
  Filled 2023-10-02: qty 2

## 2023-10-02 NOTE — ED Triage Notes (Signed)
 Pt arrived abdominal pain x1 day. States started LLQ after eating then moved to mid abdominal area after eating. Denies Dizziness, N/V or any other symptoms .

## 2023-10-03 ENCOUNTER — Emergency Department (HOSPITAL_COMMUNITY)

## 2023-10-03 DIAGNOSIS — K449 Diaphragmatic hernia without obstruction or gangrene: Secondary | ICD-10-CM | POA: Diagnosis not present

## 2023-10-03 DIAGNOSIS — R109 Unspecified abdominal pain: Secondary | ICD-10-CM | POA: Diagnosis not present

## 2023-10-03 DIAGNOSIS — S3991XA Unspecified injury of abdomen, initial encounter: Secondary | ICD-10-CM | POA: Diagnosis not present

## 2023-10-03 DIAGNOSIS — S3993XA Unspecified injury of pelvis, initial encounter: Secondary | ICD-10-CM | POA: Diagnosis not present

## 2023-10-03 LAB — URINALYSIS, ROUTINE W REFLEX MICROSCOPIC
Bacteria, UA: NONE SEEN
Bilirubin Urine: NEGATIVE
Glucose, UA: NEGATIVE mg/dL
Ketones, ur: 5 mg/dL — AB
Leukocytes,Ua: NEGATIVE
Nitrite: NEGATIVE
Protein, ur: NEGATIVE mg/dL
Specific Gravity, Urine: 1.023 (ref 1.005–1.030)
pH: 8 (ref 5.0–8.0)

## 2023-10-03 MED ORDER — IOHEXOL 300 MG/ML  SOLN
80.0000 mL | Freq: Once | INTRAMUSCULAR | Status: AC | PRN
Start: 2023-10-03 — End: 2023-10-03
  Administered 2023-10-03: 80 mL via INTRAVENOUS

## 2023-10-03 NOTE — ED Provider Notes (Signed)
 [name] AT [facility] Provider Note   CSN: [id number] Arrival date & time: 10/02/23  1814     History  Chief Complaint  Patient presents with   Abdominal Pain    Vanessa Cannon is a 88 y.o. female.  The history is provided by the patient and a relative.  Abdominal Pain Pain location:  Periumbilical Pain quality: cramping   Pain radiates to:  Does not radiate Pain severity:  Moderate Onset quality:  Sudden Timing:  Constant Progression:  Unchanged Chronicity:  Recurrent Context: not alcohol use   Relieved by:  Nothing Worsened by:  Nothing Ineffective treatments:  None tried Associated symptoms: flatus   Associated symptoms: no chest pain, no diarrhea, no dysuria, no fever, no nausea and no vomiting   Risk factors: not pregnant   Abdominal pain and gassiness x 1 day.      Past Medical History:  Diagnosis Date   Anxiety    Heart murmur    murmur as child   History of kidney stones    Sebaceous cyst    on neck     Home Medications Prior to Admission medications   Medication Sig Start Date End Date Taking? Authorizing Provider  Acetylcarnitine HCl (ACETYL L-CARNITINE PO) Take by mouth.    [provider]  albuterol (PROVENTIL HFA;VENTOLIN HFA) 108 (90 Base) MCG/ACT inhaler Inhale 1-2 puffs into the lungs every 6 (six) hours as needed for wheezing or shortness of breath.    [provider]  ASTRAGALUS PO Take by mouth.    [provider]  cholecalciferol (VITAMIN D) 1000 UNITS tablet Take 1,000 Units by mouth daily.    [provider]  diazepam (VALIUM) 5 MG tablet Take 5 mg by mouth every 6 (six) hours as needed for anxiety.    [provider]  Multiple Vitamin (MULTIVITAMIN WITH MINERALS) TABS tablet Take 1 tablet by mouth daily.    [provider]  Multiple Vitamins-Minerals (PRESERVISION AREDS PO) Take by mouth.    [provider]  Omega-3 Fatty Acids (FISH OIL)  1000 MG CAPS Take by mouth.    [provider]  traMADol (ULTRAM) 50 MG tablet Take 1 tablet (50 mg total) by mouth every 6 (six) hours as needed for severe pain. 09/09/19   [name], MD  vitamin C (ASCORBIC ACID) 250 MG tablet Take 250 mg by mouth daily.    [provider]      Allergies    Ceclor [cefaclor]    Review of Systems   Review of Systems  Constitutional:  Negative for fever.  Cardiovascular:  Negative for chest pain.  Gastrointestinal:  Positive for abdominal pain and flatus. Negative for diarrhea, nausea and vomiting.  Genitourinary:  Negative for dysuria and flank pain.  All other systems reviewed and are negative.   Physical Exam Updated Vital Signs BP (!) 164/61 (BP Location: Right Arm)   Pulse 85   Temp 98.1 F (36.7 C) (Oral)   Resp 20   Ht 5\' 2"  (1.575 m)   Wt 54.4 kg   SpO2 100%   BMI 21.95 kg/m  Physical Exam Vitals and nursing note reviewed.  Constitutional:      General: She is not in acute distress.    Appearance: Normal appearance. She is well-developed.  HENT:     Head: Normocephalic and atraumatic.     Nose: Nose normal.  Eyes:     Pupils: Pupils are equal, round, and reactive to light.  Cardiovascular:     Rate and Rhythm: Normal rate and regular rhythm.     Pulses: Normal pulses.     Heart sounds: Normal heart sounds.  Pulmonary:     Effort: Pulmonary effort is normal. No respiratory distress.     Breath sounds: Normal breath sounds.  Abdominal:     General: There is no distension.     Palpations: Abdomen is soft.     Tenderness: There is no abdominal tenderness. There is no guarding or rebound.     Comments: Gassy throughout   Musculoskeletal:        General: Normal range of motion.     Cervical back: Neck supple.  Skin:    General: Skin is dry.     Capillary Refill: Capillary refill takes less than 2 seconds.     Findings: No erythema or rash.  Neurological:     General: No focal deficit present.      Mental Status: She is alert.     Deep Tendon Reflexes: Reflexes normal.  Psychiatric:        Mood and Affect: Mood normal.     ED Results / Procedures / Treatments   Labs (all labs ordered are listed, but only abnormal results are displayed) Results for orders placed or performed during the hospital encounter of 10/02/23  Lipase, blood   Collection Time: 10/02/23  6:52 PM  Result Value Ref Range   Lipase 47 11 - 51 U/L  Comprehensive metabolic panel   Collection Time: 10/02/23  6:52 PM  Result Value Ref Range   Sodium 138 135 - 145 mmol/L   Potassium 3.6 3.5 - 5.1 mmol/L   Chloride 103 98 - 111 mmol/L   CO2 30 22 - 32 mmol/L   Glucose, Bld 107 (H) 70 - 99 mg/dL   BUN 12 8 - 23 mg/dL   Creatinine, Ser 1.61 0.44 - 1.00 mg/dL   Calcium 9.3 8.9 - 09.6 mg/dL   Total Protein 7.6 6.5 - 8.1 g/dL   Albumin 4.1 3.5 - 5.0 g/dL   AST 23 15 - 41 U/L   ALT 15 0 - 44 U/L   Alkaline Phosphatase 75 38 - 126 U/L   Total Bilirubin 0.8 0.0 - 1.2 mg/dL   GFR, Estimated >04 >54 mL/min   Anion gap 5 5 - 15  CBC   Collection Time: 10/02/23  6:52 PM  Result Value Ref Range   WBC 4.8 4.0 - 10.5 K/uL   RBC 3.93 3.87 - 5.11 MIL/uL   Hemoglobin 12.0 12.0 - 15.0 g/dL   HCT 09.8 11.9 - 14.7 %   MCV 97.5 80.0 - 100.0 fL   MCH 30.5 26.0 - 34.0 pg   MCHC 31.3 30.0 - 36.0 g/dL   RDW 82.9 56.2 - 13.0 %   Platelets 287 150 - 400 K/uL   nRBC 0.0 0.0 - 0.2 %  Urinalysis, Routine w reflex microscopic -Urine, Clean Catch   Collection Time: 10/03/23  1:55 AM  Result Value Ref Range   Color, Urine STRAW (A) YELLOW   APPearance CLEAR CLEAR   Specific Gravity, Urine 1.023 1.005 - 1.030   pH 8.0 5.0 - 8.0   Glucose, UA NEGATIVE NEGATIVE mg/dL   Hgb urine dipstick SMALL (A) NEGATIVE   Bilirubin Urine NEGATIVE NEGATIVE   Ketones, ur 5 (A) NEGATIVE mg/dL   Protein, ur NEGATIVE NEGATIVE mg/dL   Nitrite NEGATIVE NEGATIVE   Leukocytes,Ua NEGATIVE NEGATIVE   RBC / HPF 6-10 0 -  5 RBC/hpf   WBC, UA 0-5 0 - 5  WBC/hpf   Bacteria, UA NONE SEEN NONE SEEN   Squamous Epithelial / HPF 0-5 0 - 5 /HPF   Mucus PRESENT    Hyaline Casts, UA PRESENT    CT ABDOMEN PELVIS W CONTRAST Result Date: 10/03/2023 CLINICAL DATA:  Polytrauma, blunt abdominal pain x1 day. States started LLQ after eating then moved to mid abdominal area after eating. Denies Dizziness, N/V or any other symptoms . EXAM: CT ABDOMEN AND PELVIS WITH CONTRAST TECHNIQUE: Multidetector CT imaging of the abdomen and pelvis was performed using the standard protocol following bolus administration of intravenous contrast. RADIATION DOSE REDUCTION: This exam was performed according to the departmental dose-optimization program which includes automated exposure control, adjustment of the mA and/or kV according to patient size and/or use of iterative reconstruction technique. CONTRAST:  80mL OMNIPAQUE IOHEXOL 300 MG/ML  SOLN COMPARISON:  None Available. FINDINGS: Lower chest: Thin walled subcentimeter cystic changes likely benign in etiology. No acute abnormality. Tiny hiatal hernia. Hepatobiliary: Not enlarged. No focal lesion. No laceration or subcapsular hematoma. Multiple calcified gallstones within the gallbladder lumen. No gallbladder wall thickening or pericholecystic fluid. No biliary ductal dilatation. Pancreas: Normal pancreatic contour. No main pancreatic duct dilatation. Spleen: Not enlarged. No focal lesion. No laceration, subcapsular hematoma, or vascular injury. Adrenals/Urinary Tract: No nodularity bilaterally. Bilateral kidneys enhance symmetrically. No hydronephrosis. No contusion, laceration, or subcapsular hematoma. Subcentimeter hypodensities are too small to characterize-no further follow-up indicated. 2 mm calcified stone within the right kidney. No left nephrolithiasis. No ureterolithiasis bilaterally. No injury to the vascular structures or collecting systems. No hydroureter. The urinary bladder is unremarkable. On delayed imaging, there is no  urothelial wall thickening and there are no filling defects in the opacified portions of the bilateral collecting systems or ureters. Stomach/Bowel: No small or large bowel wall thickening or dilatation. Colonic diverticulosis with inspissated PO contrast within several colonic diverticula along the pelvis leading to streak artifact and limiting evaluation. The appendix is not definitely identified with no inflammatory changes in the right lower quadrant to suggest acute appendicitis. Vasculature/Lymphatics: Mild atherosclerotic plaque. No abdominal aorta or iliac aneurysm. No active contrast extravasation or pseudoaneurysm. No abdominal, pelvic, inguinal lymphadenopathy. Reproductive: Normal. Other: No simple free fluid ascites. No pneumoperitoneum. No hemoperitoneum. No mesenteric hematoma identified. No organized fluid collection. Musculoskeletal: No significant soft tissue hematoma. No acute pelvic fracture. No spinal fracture. Other ports and devices: None. IMPRESSION: 1. No acute intra-abdominal or intrapelvic traumatic injury. 2. No acute fracture or traumatic malalignment of the lumbar spine. Other imaging findings of potential clinical significance: 1. Colonic diverticulosis with no acute diverticulitis-limited evaluation of the pelvis due to streak artifact originating from PO contrast inspissated within the colonic diverticula. 2. Cholelithiasis with no CT evidence of acute cholecystitis. 3. Nonobstructive 2 mm right nephrolithiasis. 4. Tiny hiatal hernia. Electronically Signed   By: Tish Frederickson M.D.   On: 10/03/2023 03:08   DG ESOPHAGUS W DOUBLE CM (HD) Result Date: 09/24/2023 CLINICAL DATA:  Provided history: Dysphagia, unspecified type. Additional history provided: the patient reports dysphagia and abdominal discomfort relieved by belching. EXAM: ESOPHOGRAM/BARIUM SWALLOW TECHNIQUE: A combined double contrast and single contrast examination was performed using effervescent crystals, thick barium  liquid, and thin barium liquid. Additionally, the patient swallowed a 13 mm barium tablet under fluoroscopy. FLUOROSCOPY: Radiation Exposure Index (as provided by the fluoroscopic device): 19.50 mGy Kerma COMPARISON:  CT abdomen/pelvis 08/18/2023. FINDINGS: Prominent cricopharyngeus muscle impression upon the hypopharynx/cervical esophagus. Esophagus otherwise  normal in caliber and smooth in contour. No evidence of fixed stricture, mass or mucosal abnormality. Mild intermittent esophageal dysmotility with tertiary contractions. A very small sliding hiatal hernia is suspected. No gastroesophageal reflux observed. The patient swallowed a 13 mm barium tablet, which freely traversed the esophagus and passed into the stomach. IMPRESSION: 1. Prominent cricopharyngeus muscle impression upon the hypopharynx/cervical esophagus. 2. Mild esophageal dysmotility with tertiary contractions. 3. A very small sliding hiatal hernia is suspected. 4. Otherwise unremarkable esophagram, as described. Electronically Signed   By: Jackey Loge D.O.   On: 09/24/2023 15:34    Radiology CT ABDOMEN PELVIS W CONTRAST Result Date: 10/03/2023 CLINICAL DATA:  Polytrauma, blunt abdominal pain x1 day. States started LLQ after eating then moved to mid abdominal area after eating. Denies Dizziness, N/V or any other symptoms . EXAM: CT ABDOMEN AND PELVIS WITH CONTRAST TECHNIQUE: Multidetector CT imaging of the abdomen and pelvis was performed using the standard protocol following bolus administration of intravenous contrast. RADIATION DOSE REDUCTION: This exam was performed according to the departmental dose-optimization program which includes automated exposure control, adjustment of the mA and/or kV according to patient size and/or use of iterative reconstruction technique. CONTRAST:  80mL OMNIPAQUE IOHEXOL 300 MG/ML  SOLN COMPARISON:  None Available. FINDINGS: Lower chest: Thin walled subcentimeter cystic changes likely benign in etiology. No  acute abnormality. Tiny hiatal hernia. Hepatobiliary: Not enlarged. No focal lesion. No laceration or subcapsular hematoma. Multiple calcified gallstones within the gallbladder lumen. No gallbladder wall thickening or pericholecystic fluid. No biliary ductal dilatation. Pancreas: Normal pancreatic contour. No main pancreatic duct dilatation. Spleen: Not enlarged. No focal lesion. No laceration, subcapsular hematoma, or vascular injury. Adrenals/Urinary Tract: No nodularity bilaterally. Bilateral kidneys enhance symmetrically. No hydronephrosis. No contusion, laceration, or subcapsular hematoma. Subcentimeter hypodensities are too small to characterize-no further follow-up indicated. 2 mm calcified stone within the right kidney. No left nephrolithiasis. No ureterolithiasis bilaterally. No injury to the vascular structures or collecting systems. No hydroureter. The urinary bladder is unremarkable. On delayed imaging, there is no urothelial wall thickening and there are no filling defects in the opacified portions of the bilateral collecting systems or ureters. Stomach/Bowel: No small or large bowel wall thickening or dilatation. Colonic diverticulosis with inspissated PO contrast within several colonic diverticula along the pelvis leading to streak artifact and limiting evaluation. The appendix is not definitely identified with no inflammatory changes in the right lower quadrant to suggest acute appendicitis. Vasculature/Lymphatics: Mild atherosclerotic plaque. No abdominal aorta or iliac aneurysm. No active contrast extravasation or pseudoaneurysm. No abdominal, pelvic, inguinal lymphadenopathy. Reproductive: Normal. Other: No simple free fluid ascites. No pneumoperitoneum. No hemoperitoneum. No mesenteric hematoma identified. No organized fluid collection. Musculoskeletal: No significant soft tissue hematoma. No acute pelvic fracture. No spinal fracture. Other ports and devices: None. IMPRESSION: 1. No acute  intra-abdominal or intrapelvic traumatic injury. 2. No acute fracture or traumatic malalignment of the lumbar spine. Other imaging findings of potential clinical significance: 1. Colonic diverticulosis with no acute diverticulitis-limited evaluation of the pelvis due to streak artifact originating from PO contrast inspissated within the colonic diverticula. 2. Cholelithiasis with no CT evidence of acute cholecystitis. 3. Nonobstructive 2 mm right nephrolithiasis. 4. Tiny hiatal hernia. Electronically Signed   By: Tish Frederickson M.D.   On: 10/03/2023 03:08    Procedures Procedures    Medications Ordered in ED Medications  alum & mag hydroxide-simeth (MAALOX/MYLANTA) 200-200-20 MG/5ML suspension 30 mL (30 mLs Oral Given 10/02/23 2344)  ondansetron (ZOFRAN) injection 4 mg (4 mg  Intravenous Given 10/02/23 2343)  iohexol (OMNIPAQUE) 300 MG/ML solution 80 mL (80 mLs Intravenous Contrast Given 10/03/23 0034)    ED Course/ Medical Decision Making/ A&P                                 Medical Decision Making Patient with one day of abdominal pain   Amount and/or Complexity of Data Reviewed Independent Historian:     Details: Relative See above  External Data Reviewed: notes.    Details: Previous notes reviewed  Labs: ordered.    Details: Urine is negative for UTI normal LFTS,  normal sodium 138, normal potassium, normal creatinine 0.69, normal white count 4.8, normal hemoglobin  Radiology: ordered and independent interpretation performed.    Details: Constipation by me   Risk OTC drugs. Prescription drug management. Risk Details: Well appearing, gassy on exam.  I suspect this is constipation with superimposed gas pain/bowel spasm.  Stable for discharge.  Start miralax 1 capful daily.  Follow up with your PMD.  Stable for discharge.      Final Clinical Impression(s) / ED Diagnoses Final diagnoses:  Constipation, unspecified constipation type   No signs of systemic illness or infection. The  patient is nontoxic-appearing on exam and vital signs are within normal limits.  I have reviewed the triage vital signs and the nursing notes. Pertinent labs & imaging results that were available during my care of the patient were reviewed by me and considered in my medical decision making (see chart for details). After history, exam, and medical workup I feel the patient has been appropriately medically screened and is safe for discharge home. Pertinent diagnoses were discussed with the patient. Patient was given return precautio Rx / DC Orders ED Discharge Orders     None         Jeancarlo Leffler, MD 10/03/23 [phone]

## 2023-10-07 DIAGNOSIS — M810 Age-related osteoporosis without current pathological fracture: Secondary | ICD-10-CM | POA: Diagnosis not present

## 2023-10-07 DIAGNOSIS — R1310 Dysphagia, unspecified: Secondary | ICD-10-CM | POA: Diagnosis not present

## 2023-10-09 DIAGNOSIS — R2689 Other abnormalities of gait and mobility: Secondary | ICD-10-CM | POA: Diagnosis not present

## 2023-10-09 DIAGNOSIS — M542 Cervicalgia: Secondary | ICD-10-CM | POA: Diagnosis not present

## 2023-10-17 DIAGNOSIS — M542 Cervicalgia: Secondary | ICD-10-CM | POA: Diagnosis not present

## 2023-10-17 DIAGNOSIS — R2689 Other abnormalities of gait and mobility: Secondary | ICD-10-CM | POA: Diagnosis not present

## 2023-10-25 DIAGNOSIS — R141 Gas pain: Secondary | ICD-10-CM | POA: Diagnosis not present

## 2023-10-25 DIAGNOSIS — K5730 Diverticulosis of large intestine without perforation or abscess without bleeding: Secondary | ICD-10-CM | POA: Diagnosis not present

## 2023-10-25 DIAGNOSIS — K5901 Slow transit constipation: Secondary | ICD-10-CM | POA: Diagnosis not present

## 2023-10-25 DIAGNOSIS — R1311 Dysphagia, oral phase: Secondary | ICD-10-CM | POA: Diagnosis not present

## 2023-10-28 DIAGNOSIS — H353131 Nonexudative age-related macular degeneration, bilateral, early dry stage: Secondary | ICD-10-CM | POA: Diagnosis not present

## 2023-10-28 DIAGNOSIS — D3131 Benign neoplasm of right choroid: Secondary | ICD-10-CM | POA: Diagnosis not present

## 2023-10-28 DIAGNOSIS — H2513 Age-related nuclear cataract, bilateral: Secondary | ICD-10-CM | POA: Diagnosis not present

## 2023-10-30 DIAGNOSIS — M542 Cervicalgia: Secondary | ICD-10-CM | POA: Diagnosis not present

## 2023-10-30 DIAGNOSIS — R2689 Other abnormalities of gait and mobility: Secondary | ICD-10-CM | POA: Diagnosis not present

## 2023-11-09 DIAGNOSIS — M5459 Other low back pain: Secondary | ICD-10-CM | POA: Diagnosis not present

## 2023-11-12 DIAGNOSIS — R1310 Dysphagia, unspecified: Secondary | ICD-10-CM | POA: Diagnosis not present

## 2023-11-12 DIAGNOSIS — K219 Gastro-esophageal reflux disease without esophagitis: Secondary | ICD-10-CM | POA: Diagnosis not present

## 2023-11-12 DIAGNOSIS — F419 Anxiety disorder, unspecified: Secondary | ICD-10-CM | POA: Diagnosis not present

## 2023-11-12 DIAGNOSIS — S32010S Wedge compression fracture of first lumbar vertebra, sequela: Secondary | ICD-10-CM | POA: Diagnosis not present

## 2023-11-13 DIAGNOSIS — M542 Cervicalgia: Secondary | ICD-10-CM | POA: Diagnosis not present

## 2023-11-13 DIAGNOSIS — R2689 Other abnormalities of gait and mobility: Secondary | ICD-10-CM | POA: Diagnosis not present

## 2023-11-27 DIAGNOSIS — R2689 Other abnormalities of gait and mobility: Secondary | ICD-10-CM | POA: Diagnosis not present

## 2023-11-27 DIAGNOSIS — M542 Cervicalgia: Secondary | ICD-10-CM | POA: Diagnosis not present

## 2023-12-04 DIAGNOSIS — R2689 Other abnormalities of gait and mobility: Secondary | ICD-10-CM | POA: Diagnosis not present

## 2023-12-04 DIAGNOSIS — M542 Cervicalgia: Secondary | ICD-10-CM | POA: Diagnosis not present

## 2023-12-05 DIAGNOSIS — R296 Repeated falls: Secondary | ICD-10-CM | POA: Diagnosis not present

## 2023-12-05 DIAGNOSIS — M5459 Other low back pain: Secondary | ICD-10-CM | POA: Diagnosis not present

## 2023-12-10 DIAGNOSIS — S32010S Wedge compression fracture of first lumbar vertebra, sequela: Secondary | ICD-10-CM | POA: Diagnosis not present

## 2023-12-10 DIAGNOSIS — R1310 Dysphagia, unspecified: Secondary | ICD-10-CM | POA: Diagnosis not present

## 2023-12-10 DIAGNOSIS — F419 Anxiety disorder, unspecified: Secondary | ICD-10-CM | POA: Diagnosis not present

## 2023-12-10 DIAGNOSIS — K219 Gastro-esophageal reflux disease without esophagitis: Secondary | ICD-10-CM | POA: Diagnosis not present

## 2023-12-11 DIAGNOSIS — R2689 Other abnormalities of gait and mobility: Secondary | ICD-10-CM | POA: Diagnosis not present

## 2023-12-11 DIAGNOSIS — M542 Cervicalgia: Secondary | ICD-10-CM | POA: Diagnosis not present

## 2023-12-18 DIAGNOSIS — R2689 Other abnormalities of gait and mobility: Secondary | ICD-10-CM | POA: Diagnosis not present

## 2023-12-18 DIAGNOSIS — M542 Cervicalgia: Secondary | ICD-10-CM | POA: Diagnosis not present

## 2023-12-25 DIAGNOSIS — R1311 Dysphagia, oral phase: Secondary | ICD-10-CM | POA: Diagnosis not present

## 2023-12-25 DIAGNOSIS — K5901 Slow transit constipation: Secondary | ICD-10-CM | POA: Diagnosis not present

## 2023-12-25 DIAGNOSIS — M542 Cervicalgia: Secondary | ICD-10-CM | POA: Diagnosis not present

## 2023-12-25 DIAGNOSIS — R141 Gas pain: Secondary | ICD-10-CM | POA: Diagnosis not present

## 2023-12-25 DIAGNOSIS — R2689 Other abnormalities of gait and mobility: Secondary | ICD-10-CM | POA: Diagnosis not present

## 2024-01-01 DIAGNOSIS — R2689 Other abnormalities of gait and mobility: Secondary | ICD-10-CM | POA: Diagnosis not present

## 2024-01-01 DIAGNOSIS — M542 Cervicalgia: Secondary | ICD-10-CM | POA: Diagnosis not present

## 2024-01-09 DIAGNOSIS — M542 Cervicalgia: Secondary | ICD-10-CM | POA: Diagnosis not present

## 2024-01-09 DIAGNOSIS — R2689 Other abnormalities of gait and mobility: Secondary | ICD-10-CM | POA: Diagnosis not present

## 2024-01-15 DIAGNOSIS — M542 Cervicalgia: Secondary | ICD-10-CM | POA: Diagnosis not present

## 2024-01-15 DIAGNOSIS — R2689 Other abnormalities of gait and mobility: Secondary | ICD-10-CM | POA: Diagnosis not present

## 2024-01-23 DIAGNOSIS — R2689 Other abnormalities of gait and mobility: Secondary | ICD-10-CM | POA: Diagnosis not present

## 2024-01-23 DIAGNOSIS — M542 Cervicalgia: Secondary | ICD-10-CM | POA: Diagnosis not present

## 2024-02-12 DIAGNOSIS — R2689 Other abnormalities of gait and mobility: Secondary | ICD-10-CM | POA: Diagnosis not present

## 2024-02-12 DIAGNOSIS — M542 Cervicalgia: Secondary | ICD-10-CM | POA: Diagnosis not present

## 2024-02-19 DIAGNOSIS — R2689 Other abnormalities of gait and mobility: Secondary | ICD-10-CM | POA: Diagnosis not present

## 2024-02-19 DIAGNOSIS — M542 Cervicalgia: Secondary | ICD-10-CM | POA: Diagnosis not present

## 2024-02-25 DIAGNOSIS — Z23 Encounter for immunization: Secondary | ICD-10-CM | POA: Diagnosis not present

## 2024-02-25 DIAGNOSIS — K219 Gastro-esophageal reflux disease without esophagitis: Secondary | ICD-10-CM | POA: Diagnosis not present

## 2024-02-25 DIAGNOSIS — M545 Low back pain, unspecified: Secondary | ICD-10-CM | POA: Diagnosis not present

## 2024-02-25 DIAGNOSIS — G8929 Other chronic pain: Secondary | ICD-10-CM | POA: Diagnosis not present

## 2024-02-26 DIAGNOSIS — M542 Cervicalgia: Secondary | ICD-10-CM | POA: Diagnosis not present

## 2024-02-26 DIAGNOSIS — R2689 Other abnormalities of gait and mobility: Secondary | ICD-10-CM | POA: Diagnosis not present

## 2024-03-04 DIAGNOSIS — M542 Cervicalgia: Secondary | ICD-10-CM | POA: Diagnosis not present

## 2024-03-04 DIAGNOSIS — R2689 Other abnormalities of gait and mobility: Secondary | ICD-10-CM | POA: Diagnosis not present

## 2024-03-09 DIAGNOSIS — M545 Low back pain, unspecified: Secondary | ICD-10-CM | POA: Diagnosis not present

## 2024-03-09 DIAGNOSIS — M542 Cervicalgia: Secondary | ICD-10-CM | POA: Diagnosis not present

## 2024-03-09 DIAGNOSIS — Z5181 Encounter for therapeutic drug level monitoring: Secondary | ICD-10-CM | POA: Diagnosis not present

## 2024-03-09 DIAGNOSIS — M5412 Radiculopathy, cervical region: Secondary | ICD-10-CM | POA: Diagnosis not present

## 2024-03-11 DIAGNOSIS — M542 Cervicalgia: Secondary | ICD-10-CM | POA: Diagnosis not present

## 2024-03-11 DIAGNOSIS — R2689 Other abnormalities of gait and mobility: Secondary | ICD-10-CM | POA: Diagnosis not present

## 2024-03-17 DIAGNOSIS — Z23 Encounter for immunization: Secondary | ICD-10-CM | POA: Diagnosis not present

## 2024-03-17 DIAGNOSIS — R634 Abnormal weight loss: Secondary | ICD-10-CM | POA: Diagnosis not present

## 2024-03-17 DIAGNOSIS — K219 Gastro-esophageal reflux disease without esophagitis: Secondary | ICD-10-CM | POA: Diagnosis not present

## 2024-03-17 DIAGNOSIS — F419 Anxiety disorder, unspecified: Secondary | ICD-10-CM | POA: Diagnosis not present

## 2024-03-17 DIAGNOSIS — R1310 Dysphagia, unspecified: Secondary | ICD-10-CM | POA: Diagnosis not present

## 2024-03-30 DIAGNOSIS — R2689 Other abnormalities of gait and mobility: Secondary | ICD-10-CM | POA: Diagnosis not present

## 2024-03-30 DIAGNOSIS — M542 Cervicalgia: Secondary | ICD-10-CM | POA: Diagnosis not present

## 2024-04-01 DIAGNOSIS — R1311 Dysphagia, oral phase: Secondary | ICD-10-CM | POA: Diagnosis not present

## 2024-04-01 DIAGNOSIS — R141 Gas pain: Secondary | ICD-10-CM | POA: Diagnosis not present

## 2024-04-08 DIAGNOSIS — M810 Age-related osteoporosis without current pathological fracture: Secondary | ICD-10-CM | POA: Diagnosis not present

## 2024-04-13 DIAGNOSIS — Z79899 Other long term (current) drug therapy: Secondary | ICD-10-CM | POA: Diagnosis not present

## 2024-04-13 DIAGNOSIS — M5412 Radiculopathy, cervical region: Secondary | ICD-10-CM | POA: Diagnosis not present

## 2024-04-13 DIAGNOSIS — M5459 Other low back pain: Secondary | ICD-10-CM | POA: Diagnosis not present

## 2024-04-13 DIAGNOSIS — M4856XD Collapsed vertebra, not elsewhere classified, lumbar region, subsequent encounter for fracture with routine healing: Secondary | ICD-10-CM | POA: Diagnosis not present

## 2024-04-15 DIAGNOSIS — M545 Low back pain, unspecified: Secondary | ICD-10-CM | POA: Diagnosis not present

## 2024-04-16 DIAGNOSIS — R1310 Dysphagia, unspecified: Secondary | ICD-10-CM | POA: Diagnosis not present

## 2024-04-16 DIAGNOSIS — R634 Abnormal weight loss: Secondary | ICD-10-CM | POA: Diagnosis not present

## 2024-04-16 DIAGNOSIS — F419 Anxiety disorder, unspecified: Secondary | ICD-10-CM | POA: Diagnosis not present

## 2024-04-16 DIAGNOSIS — K219 Gastro-esophageal reflux disease without esophagitis: Secondary | ICD-10-CM | POA: Diagnosis not present

## 2024-04-22 DIAGNOSIS — M542 Cervicalgia: Secondary | ICD-10-CM | POA: Diagnosis not present

## 2024-04-22 DIAGNOSIS — R2689 Other abnormalities of gait and mobility: Secondary | ICD-10-CM | POA: Diagnosis not present

## 2024-04-29 DIAGNOSIS — R2689 Other abnormalities of gait and mobility: Secondary | ICD-10-CM | POA: Diagnosis not present

## 2024-04-29 DIAGNOSIS — M542 Cervicalgia: Secondary | ICD-10-CM | POA: Diagnosis not present

## 2024-05-12 DIAGNOSIS — M47816 Spondylosis without myelopathy or radiculopathy, lumbar region: Secondary | ICD-10-CM | POA: Diagnosis not present

## 2024-05-12 DIAGNOSIS — M5136 Other intervertebral disc degeneration, lumbar region with discogenic back pain only: Secondary | ICD-10-CM | POA: Diagnosis not present

## 2024-05-12 DIAGNOSIS — M5412 Radiculopathy, cervical region: Secondary | ICD-10-CM | POA: Diagnosis not present

## 2024-05-15 DIAGNOSIS — R1311 Dysphagia, oral phase: Secondary | ICD-10-CM | POA: Diagnosis not present

## 2024-05-15 DIAGNOSIS — R141 Gas pain: Secondary | ICD-10-CM | POA: Diagnosis not present

## 2024-05-15 DIAGNOSIS — K5901 Slow transit constipation: Secondary | ICD-10-CM | POA: Diagnosis not present

## 2024-05-18 DIAGNOSIS — K5909 Other constipation: Secondary | ICD-10-CM | POA: Diagnosis not present

## 2024-05-18 DIAGNOSIS — R634 Abnormal weight loss: Secondary | ICD-10-CM | POA: Diagnosis not present

## 2024-05-18 DIAGNOSIS — M542 Cervicalgia: Secondary | ICD-10-CM | POA: Diagnosis not present

## 2024-05-18 DIAGNOSIS — F32A Depression, unspecified: Secondary | ICD-10-CM | POA: Diagnosis not present

## 2024-05-18 DIAGNOSIS — K219 Gastro-esophageal reflux disease without esophagitis: Secondary | ICD-10-CM | POA: Diagnosis not present

## 2024-05-20 DIAGNOSIS — R2689 Other abnormalities of gait and mobility: Secondary | ICD-10-CM | POA: Diagnosis not present

## 2024-05-20 DIAGNOSIS — M542 Cervicalgia: Secondary | ICD-10-CM | POA: Diagnosis not present

## 2024-05-27 DIAGNOSIS — M542 Cervicalgia: Secondary | ICD-10-CM | POA: Diagnosis not present

## 2024-05-27 DIAGNOSIS — R2689 Other abnormalities of gait and mobility: Secondary | ICD-10-CM | POA: Diagnosis not present

## 2024-06-03 DIAGNOSIS — R2689 Other abnormalities of gait and mobility: Secondary | ICD-10-CM | POA: Diagnosis not present

## 2024-06-03 DIAGNOSIS — M542 Cervicalgia: Secondary | ICD-10-CM | POA: Diagnosis not present
